# Patient Record
Sex: Female | Born: 1965 | Race: White | Hispanic: No | Marital: Married | Smoking: Current every day smoker
Health system: Southern US, Community
[De-identification: ages and names within clinical notes are randomized; demographics above are authoritative.]

## PROBLEM LIST (undated history)

## (undated) DIAGNOSIS — K644 Residual hemorrhoidal skin tags: Secondary | ICD-10-CM

## (undated) DIAGNOSIS — G43909 Migraine, unspecified, not intractable, without status migrainosus: Secondary | ICD-10-CM

## (undated) DIAGNOSIS — F329 Major depressive disorder, single episode, unspecified: Secondary | ICD-10-CM

## (undated) DIAGNOSIS — M5430 Sciatica, unspecified side: Secondary | ICD-10-CM

## (undated) DIAGNOSIS — F32A Depression, unspecified: Secondary | ICD-10-CM

## (undated) HISTORY — PX: KNEE ARTHROSCOPY: SUR90

## (undated) HISTORY — DX: Sciatica, unspecified side: M54.30

## (undated) HISTORY — DX: Residual hemorrhoidal skin tags: K64.4

## (undated) HISTORY — PX: EYE SURGERY: SHX253

## (undated) HISTORY — DX: Migraine, unspecified, not intractable, without status migrainosus: G43.909

## (undated) HISTORY — PX: LAPAROSCOPY: SHX197

---

## 2005-03-30 ENCOUNTER — Emergency Department (HOSPITAL_COMMUNITY): Admission: EM | Admit: 2005-03-30 | Discharge: 2005-03-30 | Payer: Self-pay | Admitting: Emergency Medicine

## 2006-02-21 DIAGNOSIS — Z7289 Other problems related to lifestyle: Secondary | ICD-10-CM | POA: Insufficient documentation

## 2006-03-09 ENCOUNTER — Emergency Department: Payer: Self-pay | Admitting: Emergency Medicine

## 2006-08-21 DIAGNOSIS — J309 Allergic rhinitis, unspecified: Secondary | ICD-10-CM | POA: Insufficient documentation

## 2007-10-10 DIAGNOSIS — G43109 Migraine with aura, not intractable, without status migrainosus: Secondary | ICD-10-CM | POA: Insufficient documentation

## 2008-01-14 DIAGNOSIS — F419 Anxiety disorder, unspecified: Secondary | ICD-10-CM | POA: Insufficient documentation

## 2008-01-14 DIAGNOSIS — R0789 Other chest pain: Secondary | ICD-10-CM | POA: Insufficient documentation

## 2008-01-15 ENCOUNTER — Other Ambulatory Visit: Payer: Self-pay

## 2008-01-15 ENCOUNTER — Emergency Department: Payer: Self-pay | Admitting: Emergency Medicine

## 2008-07-02 DIAGNOSIS — M545 Low back pain, unspecified: Secondary | ICD-10-CM | POA: Insufficient documentation

## 2008-09-21 ENCOUNTER — Ambulatory Visit: Payer: Self-pay | Admitting: Family Medicine

## 2008-11-09 ENCOUNTER — Ambulatory Visit: Payer: Self-pay | Admitting: Unknown Physician Specialty

## 2008-12-07 ENCOUNTER — Ambulatory Visit: Payer: Self-pay | Admitting: Unknown Physician Specialty

## 2008-12-08 ENCOUNTER — Ambulatory Visit: Payer: Self-pay | Admitting: Unknown Physician Specialty

## 2009-02-13 ENCOUNTER — Emergency Department: Payer: Self-pay | Admitting: Emergency Medicine

## 2009-03-08 DIAGNOSIS — S39012A Strain of muscle, fascia and tendon of lower back, initial encounter: Secondary | ICD-10-CM | POA: Insufficient documentation

## 2009-03-29 ENCOUNTER — Ambulatory Visit: Payer: Self-pay | Admitting: Family Medicine

## 2009-04-17 ENCOUNTER — Emergency Department (HOSPITAL_COMMUNITY): Admission: EM | Admit: 2009-04-17 | Discharge: 2009-04-18 | Payer: Self-pay | Admitting: Emergency Medicine

## 2009-04-18 DIAGNOSIS — J45909 Unspecified asthma, uncomplicated: Secondary | ICD-10-CM | POA: Insufficient documentation

## 2009-05-19 ENCOUNTER — Ambulatory Visit: Payer: Self-pay | Admitting: Family Medicine

## 2009-09-25 ENCOUNTER — Ambulatory Visit: Payer: Self-pay | Admitting: Family Medicine

## 2009-09-25 DIAGNOSIS — R519 Headache, unspecified: Secondary | ICD-10-CM | POA: Insufficient documentation

## 2009-09-25 DIAGNOSIS — R51 Headache: Secondary | ICD-10-CM | POA: Insufficient documentation

## 2010-01-20 ENCOUNTER — Ambulatory Visit: Payer: Self-pay | Admitting: Family Medicine

## 2010-01-20 DIAGNOSIS — F17200 Nicotine dependence, unspecified, uncomplicated: Secondary | ICD-10-CM | POA: Insufficient documentation

## 2010-03-21 LAB — LIPID PANEL
CHOLESTEROL: 201 mg/dL — AB (ref 0–200)
HDL: 69 mg/dL (ref 35–70)
LDL CALC: 115 mg/dL
Triglycerides: 86 mg/dL (ref 40–160)

## 2010-03-21 LAB — BASIC METABOLIC PANEL
BUN: 9 mg/dL (ref 4–21)
Creatinine: 0.7 mg/dL (ref 0.5–1.1)
Potassium: 4.3 mmol/L (ref 3.4–5.3)
Sodium: 139 mmol/L (ref 137–147)

## 2010-04-11 ENCOUNTER — Telehealth (INDEPENDENT_AMBULATORY_CARE_PROVIDER_SITE_OTHER): Payer: Self-pay

## 2010-04-11 ENCOUNTER — Ambulatory Visit
Admission: RE | Admit: 2010-04-11 | Discharge: 2010-04-11 | Payer: Self-pay | Source: Home / Self Care | Attending: Family Medicine | Admitting: Family Medicine

## 2010-04-11 DIAGNOSIS — B070 Plantar wart: Secondary | ICD-10-CM | POA: Insufficient documentation

## 2010-04-15 ENCOUNTER — Encounter: Payer: Self-pay | Admitting: Family Medicine

## 2010-05-09 NOTE — Assessment & Plan Note (Signed)
Summary: MIGRAINE/EVM   Vital Signs:  Patient profile:   45 year old female Height:      70 inches Weight:      187 pounds O2 Sat:      100 % on Room air Temp:     97.7 degrees F oral Pulse rate:   88 / minute Pulse rhythm:   regular Resp:     18 per minute BP sitting:   139 / 86  (right arm)  Vitals Entered By: Levonne Spiller EMT-P (January 20, 2010 11:28 AM)  O2 Flow:  Room air CC: Headache, Possible Migraine Is Patient Diabetic? No Pain Assessment Patient in pain? yes     Location: head Intensity: 7 Type: aching  Does patient need assistance? Functional Status Shopping in her or her is she pulled  Visit Type:  headache - migraine  Chief Complaint:  Headache and Possible Migraine.  History of Present Illness: This patient is a 45 year old [place] female presenting today complaining of a headache that's been present for the past several hours. She reported that it initially started as a tension type headache which she has had in the past but he has transformed into a migraine headache. She's having nausea and photophobia. She does have a history of migraine headaches. She had been taking Fioricet in the past but has run out of her medication. She reports that she's not having any vomiting or fever or chills. The patient reports that she would like to go home and sleep but she's having to work today in the [employer].the patient reports that she is planning to go home. She has been under tremendous stress under the last 4 days because her ex-husband committed suicide last week. Her children have been under distress and she has been working with him and her ex in-laws. The patient reports that she is not sleeping well.  The patient reports that her pain is 7/10.  Preventive Screening-Counseling & Management  Alcohol-Tobacco     Smoking Status: current     Packs/Day: 1.0     Tobacco Counseling: to quit use of tobacco products  Caffeine-Diet-Exercise     Caffeine  use/day: 10 cups/ perday  Problems Prior to Update: 1)  Headache  (ICD-784.0)  Allergies (verified): No Known Drug Allergies  Past History:  Family History: Last updated: 09/25/2009 Father: Alive, ok Mother: died from Brain cancer, metastatic from breast Siblings: 1 sister, ok  Social History: Last updated: 09/25/2009 Married, 4 children Current Smoker Alcohol use-no Drug use-no  Risk Factors: Caffeine Use: 10 cups/ perday (01/20/2010)  Risk Factors: Smoking Status: current (01/20/2010) Packs/Day: 1.0 (01/20/2010)  Past Medical History: migraine headaches Tension-type headaches Chronic active nicotine dependence  Past Surgical History: Caesarean sections-3 times 8/10- knee operation  Family History: Reviewed history from 09/25/2009 and no changes required. Father: Alive, ok Mother: died from Brain cancer, metastatic from breast Siblings: 1 sister, ok  Social History: Reviewed history from 09/25/2009 and no changes required. Married, 4 children Current Smoker Alcohol use-no Drug use-no Packs/Day:  1.0 Caffeine use/day:  10 cups/ perday  Review of Systems       The patient complains of headaches.  The patient denies anorexia, fever, weight loss, weight gain, vision loss, decreased hearing, hoarseness, chest pain, syncope, dyspnea on exertion, peripheral edema, prolonged cough, hemoptysis, abdominal pain, melena, hematochezia, severe indigestion/heartburn, hematuria, incontinence, genital sores, muscle weakness, suspicious skin lesions, transient blindness, difficulty walking, depression, unusual weight change, abnormal bleeding, enlarged lymph nodes, angioedema, and breast masses.  Physical Exam  General:  Well-developed,well-nourished,in no acute distress; alert,appropriate and cooperative throughout examination Head:  Normocephalic and atraumatic without obvious abnormalities. No apparent alopecia or balding. Eyes:  No corneal or conjunctival  inflammation noted. EOMI. Perrla. Funduscopic exam benign, without hemorrhages, exudates or papilledema. Vision grossly normal. Ears:  External ear exam shows no significant lesions or deformities.  Otoscopic examination reveals clear canals, tympanic membranes are intact bilaterally without bulging, retraction, inflammation or discharge. Hearing is grossly normal bilaterally. Nose:  mucosal erythema and mucosal edema.   Mouth:  Oral mucosa and oropharynx without lesions or exudates.  Teeth in good repair. tonsils hypertropied.   Neck:  No deformities, masses, or tenderness noted. Chest Wall:  No deformities, masses, or tenderness noted. Lungs:  Normal respiratory effort, chest expands symmetrically. Lungs are clear to auscultation, no crackles or wheezes. Heart:  Normal rate and regular rhythm. S1 and S2 normal without gallop, murmur, click, rub or other extra sounds. Abdomen:  Bowel sounds positive,abdomen soft and non-tender without masses, organomegaly or hernias noted. Pulses:  R and L carotid,radial,femoral,dorsalis pedis and posterior tibial pulses are full and equal bilaterally Extremities:  No clubbing, cyanosis, edema, or deformity noted with normal full range of motion of all joints.   Neurologic:  No cranial nerve deficits noted. Station and gait are normal. Plantar reflexes are down-going bilaterally. DTRs are symmetrical throughout. Sensory, motor and coordinative functions appear intact. Cervical Nodes:  R anterior LN tender and R posterior LN tender.   Psych:  Cognition and judgment appear intact. Alert and cooperative with normal attention span and concentration. No apparent delusions, illusions, hallucinations   Impression & Recommendations:  Problem # 1:  MIGRAINE HEADACHE (ICD-346.90) Assessment New  Her updated medication list for this problem includes:    Fioricet 50-325-40 Mg Tabs (Butalbital-apap-caffeine) .Marland Kitchen... 1 q 4h as needed for  headache.  Orders: Ketorolac-Toradol 60 mg (E4540)  Phenergan 25 mg by mouth every 6 hours as needed for nausea and vomiting  The risks, benefits and possible side effects were clearly explained and discussed with the patient.  The patient verbalized clear understanding.  The patient was given instructions to return if symptoms don't improve, worsen or new changes develop.  If it is not during clinic hours and the patient cannot get back to this clinic then the patient was told to seek medical care at an available urgent care or emergency department.  The patient verbalized understanding.    Problem # 2:  CIGARETTE SMOKER (ICD-305.1) Assessment: Unchanged The patient was counseled and advised to stop using all tobacco products.  Medical assistance was offered and the patient was encouraged to call 1-800-QUIT-NOW to get a smoking cessation coach.     Complete Medication List: 1)  Fioricet 50-325-40 Mg Tabs (Butalbital-apap-caffeine) .Marland Kitchen.. 1 q 4h as needed for headache. 2)  Promethazine Hcl 25 Mg Tabs (Promethazine hcl) .... Take 1 by mouth every 6 hours as needed for nausea and vomiting. will cause drowsiness  Other Orders: Ketorolac-Toradol 15mg  ([id number])  Patient Instructions: 1)  Oral Rehydration Solution: drink 1/2 ounce every 15 minutes. If tolerated afert 1 hour, drink 1 ounce every 15 minutes. As you can tolerate, keep adding 1/2 ounce every 15 minutes, up to a total of 2-4 ounces. Contact the office if unable to tolerate oral solution, if you keep vomiting, or you continue to have signs of dehydration. 2)  It was clearly explained to the patient that this [facility] is not intended to be a primary care clinic.  The patient is always better served by the continuity of care and the provider/patient relationships developed with their dedicated primary care provider.  The patient was told to be sure to follow up as soon as possible with their primary care provider to discuss treatments  received and to receive further examination and testing.  The patient verbalized understanding. The will f/u with PCP ASAP. 3)  The patient was informed that there is no on-call provider or services available at this clinic during off-hours (when the clinic is closed).  If the patient developed a problem or concern that required immediate attention, the patient was advised to go the the nearest available urgent care or emergency department for medical care.  The patient verbalized understanding.    4)  Please schedule an appointment with your primary doctor in : 1-3 days Prescriptions: PROMETHAZINE HCL 25 MG TABS (PROMETHAZINE HCL) take 1 by mouth every 6 hours as needed for nausea and vomiting. Will cause drowsiness  #20 x 0   Entered and Authorized by:   Standley Dakins MD   Signed by:   Standley Dakins MD on 01/20/2010   Method used:   Electronically to        [facility] [address] (retail)       [address], [address]       [place], [place]  [address]       Ph: [phone]       Fax: [phone]   RxID:   [id number] FIORICET 50-325-40 MG TABS (BUTALBITAL-APAP-CAFFEINE) 1 q 4h as needed for headache.  #30 x 0   Entered and Authorized by:   Standley Dakins MD   Signed by:   Standley Dakins MD on 01/20/2010   Method used:   Electronically to        [facility] [address] (retail)       [address], [address]       [place], [place]  [address]       Ph: [phone]       Fax: [phone]   RxID:   [id number]    Medication Administration  Injection # 1:    Medication: Tordol 60mg     Diagnosis: MIGRAINE HEADACHE (ICD-346.90)    Route: IM    Site: LUOQ gluteus    Exp Date: 03/10/2011    Lot #: [id number]    Mfr: Hospira    Patient tolerated injection without complications    Given by: Levonne Spiller EMT-P (January 20, 2010 11:51 AM)  Orders Added: 1)  Ketorolac-Toradol 15mg  [[id number]]  Rodney Langton,  M.D., F.A.A.F.P.  January 20, 2010

## 2010-05-09 NOTE — Assessment & Plan Note (Signed)
Summary: MIGRAINE/JBB   Vital Signs:  Patient Profile:   45 Years Old Female CC:      Headache / RWT Height:     70 inches Weight:      174 pounds BMI:     25.06 O2 Sat:      100 % O2 treatment:    Room Air Temp:     97.9 degrees F oral Pulse rate:   68 / minute Pulse rhythm:   regular Resp:     18 per minute BP sitting:   121 / 80  (left arm)  Pt. in pain?   yes    Location:   head    Intensity:   7    Type:       aching  Vitals Entered By: Levonne Spiller EMT-P (September 25, 2009 11:57 AM)              Is Patient Diabetic? No Comments Pt. is a smoker. 1 pack per day.  / RWT      Current Allergies: No known allergies History of Present Illness History from: patient Chief Complaint: Headache / RWT History of Present Illness: She works her @ [employer] and has been having a headache for 4 days-she thought may have been sinus, but not getting better and now thinks it is a migraine.  She can take a few Fioricets and that usually takes care of it.  She says she only gets headaches every 2 months or so, and so does not need anything to prevent them. She thought that may be from allergies, triggered this one, but now does not think so.  She is not having photophobia,, no nausea, and hurts frontal area.  REVIEW OF SYSTEMS Constitutional Symptoms      Denies fever, chills, night sweats, weight loss, weight gain, and fatigue.  Eyes       Denies change in vision, eye pain, eye discharge, glasses, contact lenses, and eye surgery. Ear/Nose/Throat/Mouth       Complains of sinus problems.      Denies hearing loss/aids, change in hearing, ear pain, ear discharge, dizziness, frequent runny nose, frequent nose bleeds, sore throat, hoarseness, and tooth pain or bleeding.  Respiratory       Denies dry cough, productive cough, wheezing, shortness of breath, asthma, bronchitis, and emphysema/COPD.  Cardiovascular       Denies murmurs, chest pain, and tires easily with exhertion.     Gastrointestinal       Denies stomach pain, nausea/vomiting, diarrhea, constipation, blood in bowel movements, and indigestion. Genitourniary       Denies painful urination, kidney stones, and loss of urinary control. Neurological       Complains of headaches.      Denies paralysis, seizures, and fainting/blackouts. Musculoskeletal       Denies muscle pain, joint pain, joint stiffness, decreased range of motion, redness, swelling, muscle weakness, and gout.  Skin       Denies bruising, unusual mles/lumps or sores, and hair/skin or nail changes.  Psych       Denies mood changes, temper/anger issues, anxiety/stress, speech problems, depression, and sleep problems. Other Comments: Take Claritin for allergies. has tried a few Advil for the headache, no relief.   Past History:  Past medical, surgical, family and social histories (including risk factors) reviewed for relevance to current acute and chronic problems.  Past Surgical History: Caesarean sections-3 times 8/10- knee   Family History: Reviewed history and no changes required. Father: Alive, ok  Mother: died from Brain cancer, metastatic from breast Siblings: 1 sister, ok  Social History: Reviewed history and no changes required. Married, 4 children Current Smoker Alcohol use-no Drug use-no Smoking Status:  current Drug Use:  no Physical Exam General appearance: well developed, well nourished, no acute distress, appears a little uncomftorble Head: normocephalic, atraumatic Eyes: conjunctivae and lids normal Pupils: equal, round, reactive to light Ears: normal, no lesions or deformities Nasal: mucosa pink, nonedematous, no septal deviation, turbinates normal Oral/Pharynx: tongue normal, posterior pharynx with erythema, but nor exudate Neck: neck supple,  trachea midline, no masses Thyroid: no nodules, masses, tenderness, or enlargement Chest/Lungs: no rales, wheezes. Mild  rhonchi bilateral, clearing after  cough-usual smoker's, allergies cough she says. Heart: regular rate and  rhythm, no murmur Extremities: normal extremities Neurological: grossly intact and non-focal Skin: no obvious rashes or lesions MSE: oriented to time, place, and person. Affect approriate. Assessment New Problems: HEADACHE (ICD-784.0)  She calls this a "migraine" but appears to be more of tension.  If no more than once in 2 months, not that bad.  Plan New Medications/Changes: FIORICET 50-325-40 MG TABS (BUTALBITAL-APAP-CAFFEINE) 1 q 4h as needed for headache.  #30 x 0, 09/26/2009, Oniyah Rohe MD  New Orders: New Patient Level III [phone]  The patient and/or caregiver has been counseled thoroughly with regard to medications prescribed including dosage, schedule, interactions, rationale for use, and possible side effects and they verbalize understanding.  Diagnoses and expected course of recovery discussed and will return if not improved as expected or if the condition worsens. Patient and/or caregiver verbalized understanding.  Prescriptions: FIORICET 50-325-40 MG TABS (BUTALBITAL-APAP-CAFFEINE) 1 q 4h as needed for headache.  #30 x 0   Entered and Authorized by:   Kathrynn Running MD   Signed by:   Standley Dakins MD on 09/26/2009   Method used:   Telephoned to ...       [facility] [address] (retail)       [address]       [place], [place]  [address]       Ph: [phone]       Fax: [phone]   RxID:   [id number]   Patient Instructions: 1)  If no better and feels like sinus infection in next 2 days or so, call or stop by-may need antibiotic. 2)  Also may use 4 Advil gelcaps at once for headache.   Orders Added: 1)  New Patient Level III [99203]   It was clearly explained to the patient that this [facility] is not intended to be a primary care clinic.  The patient is always better served by the continuity of care and the provider/patient  relationships developed with their dedicated primary care provider.  The patient was told to be sure to follow up as soon as possible with their primary care provider to discuss treatments received and to receive further examination and testing.  The patient verbalized understanding. The will f/u with PCP ASAP.   The risks, benefits and possible side effects were clearly explained and discussed with the patient.  The patient verbalized clear understanding.  The patient was given instructions to return if symptoms don't improve, worsen or new changes develop.  If it is not during clinic hours and the patient cannot get back to this clinic then the patient was told to seek medical care at an available urgent care or emergency department.  The patient verbalized  understanding.    I have reviewed the above medical office visit documention, including diagnoses, history, medications, clinical lists, orders and plan of care.   Rodney Langton, MD, FAAFP  September 26, 2009

## 2010-05-11 NOTE — Progress Notes (Signed)
  Phone Note Outgoing Call   Call placed by: Levonne Spiller EMT-P,  April 11, 2010 5:30 PM Call placed to: Patient Action Taken: Phone Call Completed Summary of Call: [facility] Jan. 12th @ 2:30.  Pt. was advised to contact [facility] if she has a schedule conflict, and to also contact our office if she has any questions. / rwt Initial call taken by: Levonne Spiller EMT-P,  April 11, 2010 5:32 PM

## 2010-05-11 NOTE — Miscellaneous (Signed)
Summary: Tramadol not working  Clinical Lists Changes   Pt walked up to the front desk and said that the tramadol is not working to help her foot pain and she is scheduled to see her podiatrist next week.  She says that it tends to hurt more at night and needs something to take at night.  She asked for another pain medicine to be given.     Medications: Added new medication of HYDROCODONE-ACETAMINOPHEN 5-500 MG TABS (HYDROCODONE-ACETAMINOPHEN) take 1 by mouth at HS as needed severe foot pain: Caution Will Cause Drowsiness - Signed Rx of HYDROCODONE-ACETAMINOPHEN 5-500 MG TABS (HYDROCODONE-ACETAMINOPHEN) take 1 by mouth at HS as needed severe foot pain: Caution Will Cause Drowsiness;  #15 x 0;  Signed;  Entered by: Standley Dakins MD;  Authorized by: Standley Dakins MD;  Method used: Print then Give to Patient    Prescriptions: HYDROCODONE-ACETAMINOPHEN 5-500 MG TABS (HYDROCODONE-ACETAMINOPHEN) take 1 by mouth at HS as needed severe foot pain: Caution Will Cause Drowsiness  #15 x 0   Entered and Authorized by:   Standley Dakins MD   Signed by:   Standley Dakins MD on 04/15/2010   Method used:   Print then Give to Patient   RxID:   [id number]

## 2010-05-11 NOTE — Assessment & Plan Note (Signed)
Summary: toe problems/bursitis/jbb   Vital Signs:  Patient profile:   45 year old female Height:      70 inches Weight:      192 pounds BMI:     27.65 O2 Sat:      99 % on Room air Temp:     97.8 degrees F oral Pulse rate:   89 / minute Pulse rhythm:   regular Resp:     18 per minute BP sitting:   125 / 83  (right arm)  Vitals Entered By: Levonne Spiller EMT-P (April 11, 2010 4:17 PM)  O2 Flow:  Room air CC: Left foot pain/ Toes Is Patient Diabetic? No Pain Assessment Patient in pain? yes     Location: foot Intensity: 7 Type: aching Onset of pain  Gradual  Does patient need assistance? Functional Status Self care Ambulation Normal   Chief Complaint:  Left foot pain/ Toes.  History of Present Illness: The patient is presenting today because she has been having pain in the left 4th and 5th toes.  She says that she is concerned about the worsening pain that she has been experiencing in the area.  The patient is presenting today because she is having a lot of pain in the foot.  She is reporting that she is having trouble walking and putting pressure on the foot when walking.  She says that it feels like there is a callous there.  The patient is reporting that she really has a lot of pain if that area between the toes is touched or manipulated.  She reports that she is not sure where it came from.  She has not had any history of problems with her feet or toes before.  She is not having any fever or chills or rash or nausea.   Allergies (verified): No Known Drug Allergies  Past History:  Family History: Last updated: 09/25/2009 Father: Alive, ok Mother: died from Brain cancer, metastatic from breast Siblings: 1 sister, ok  Social History: Last updated: 09/25/2009 Married, 4 children Current Smoker Alcohol use-no Drug use-no  Risk Factors: Caffeine Use: 10 cups/ perday (01/20/2010)  Risk Factors: Smoking Status: current (01/20/2010) Packs/Day: 1.0  (01/20/2010)  Past Medical History: Reviewed history from 01/20/2010 and no changes required. migraine headaches Tension-type headaches Chronic active nicotine dependence  Past Surgical History: Reviewed history from 01/20/2010 and no changes required. Caesarean sections-3 times 8/10- knee operation  Family History: Reviewed history from 09/25/2009 and no changes required. Father: Alive, ok Mother: died from Brain cancer, metastatic from breast Siblings: 1 sister, ok  Social History: Reviewed history from 09/25/2009 and no changes required. Married, 4 children Current Smoker Alcohol use-no Drug use-no  Review of Systems General:  Denies chills, fatigue, fever, loss of appetite, malaise, sleep disorder, sweats, weakness, and weight loss. Eyes:  Denies blurring, discharge, double vision, eye irritation, eye pain, halos, itching, light sensitivity, red eye, vision loss-1 eye, and vision loss-both eyes. ENT:  Denies decreased hearing, difficulty swallowing, ear discharge, earache, hoarseness, nasal congestion, nosebleeds, postnasal drainage, ringing in ears, sinus pressure, and sore throat. CV:  Denies bluish discoloration of lips or nails, chest pain or discomfort, difficulty breathing at night, difficulty breathing while lying down, fainting, fatigue, leg cramps with exertion, lightheadness, near fainting, palpitations, shortness of breath with exertion, swelling of feet, swelling of hands, and weight gain. Resp:  Denies chest discomfort, chest pain with inspiration, cough, coughing up blood, excessive snoring, hypersomnolence, morning headaches, pleuritic, shortness of breath, sputum productive,  and wheezing. GI:  Denies abdominal pain, bloody stools, change in bowel habits, constipation, dark tarry stools, diarrhea, excessive appetite, gas, hemorrhoids, indigestion, loss of appetite, nausea, vomiting, vomiting blood, and yellowish skin color. GU:  Denies abnormal vaginal bleeding,  decreased libido, discharge, dysuria, genital sores, hematuria, incontinence, nocturia, urinary frequency, and urinary hesitancy. MS:  Complains of joint pain; denies joint redness, joint swelling, loss of strength, low back pain, mid back pain, muscle aches, muscle , cramps, muscle weakness, stiffness, and thoracic pain; patient having pain between toes 4-5 on the left foot.  . Derm:  Denies changes in color of skin, changes in nail beds, dryness, excessive perspiration, flushing, hair loss, insect bite(s), itching, lesion(s), poor wound healing, and rash. Neuro:  Denies brief paralysis, difficulty with concentration, disturbances in coordination, falling down, headaches, inability to speak, memory loss, numbness, poor balance, seizures, sensation of room spinning, tingling, tremors, visual disturbances, and weakness. Psych:  Denies alternate hallucination ( auditory/visual), anxiety, depression, easily angered, easily tearful, irritability, mental problems, panic attacks, sense of great danger, suicidal thoughts/plans, thoughts of violence, unusual visions or sounds, and thoughts /plans of harming others. Endo:  Denies cold intolerance, excessive hunger, excessive thirst, excessive urination, heat intolerance, polyuria, and weight change. Heme:  Denies abnormal bruising, bleeding, enlarge lymph nodes, fevers, pallor, and skin discoloration. Allergy:  Denies hives or rash, itching eyes, persistent infections, seasonal allergies, and sneezing.  Physical Exam  General:  Well-developed,well-nourished,in no acute distress; alert,appropriate and cooperative throughout examination Head:  Normocephalic and atraumatic without obvious abnormalities. No apparent alopecia or balding. Eyes:  No corneal or conjunctival inflammation noted. EOMI. Perrla. Funduscopic exam benign, without hemorrhages, exudates or papilledema. Vision grossly normal. Ears:  External ear exam shows no significant lesions or deformities.   Otoscopic examination reveals clear canals, tympanic membranes are intact bilaterally without bulging, retraction, inflammation or discharge. Hearing is grossly normal bilaterally. Nose:  External nasal examination shows no deformity or inflammation. Nasal mucosa are pink and moist without lesions or exudates. Mouth:  Oral mucosa and oropharynx without lesions or exudates.  Teeth in good repair. Neck:  No deformities, masses, or tenderness noted. Lungs:  Normal respiratory effort, chest expands symmetrically. Lungs are clear to auscultation, no crackles or wheezes. Heart:  Normal rate and regular rhythm. S1 and S2 normal without gallop, murmur, click, rub or other extra sounds. Abdomen:  Bowel sounds positive,abdomen soft and non-tender without masses, organomegaly or hernias noted. Msk:  No deformity or scoliosis noted of thoracic or lumbar spine.   Extremities:  Large plantar wart present between the 4-5 toes on the left foot.  It is approximately 3-5 mm in diameter with a central core and very exquisitely tender to palpation and manipulation.  Neurologic:  No cranial nerve deficits noted. Station and gait are normal. Plantar reflexes are down-going bilaterally. DTRs are symmetrical throughout. Sensory, motor and coordinative functions appear intact. Skin:  Intact without suspicious lesions or rashes Cervical Nodes:  No lymphadenopathy noted Psych:  Cognition and judgment appear intact. Alert and cooperative with normal attention span and concentration. No apparent delusions, illusions, hallucinations   Impression & Recommendations:  Problem # 1:  PLANTAR WART (ICD-078.12)  Pt is going to be referred to a podiatrist asap to have an evaluation done and treatment and removal of the wart. The patient is scheduled to go to the podiatrist for an evaluation on January 12.  She was notified of the appointment time and date.   The patient was told that she needed to  protect the area with Dr.Scholls  foot pads to the area for buffering protection.  Also, we wrapped it in the office with gauze and tape it so that it will be secure.   I gave her an RX for tramadol 50 mg to take as needed for severe pain and she will take tylenol and ibuprofen as needed for the inflammation.  I explained to her that she would most likely require some ablation therapy like cryotherapy treatment or laser treatment or surgery but that would be up to the podiatrist after they have had an opportunity to evaluate her condition. The patient verbalized clear understanding.    Complete Medication List: 1)  Fioricet 50-325-40 Mg Tabs (Butalbital-apap-caffeine) .Marland Kitchen.. 1 q 4h as needed for headache. 2)  Tramadol Hcl 50 Mg Tabs (Tramadol hcl) .... Take 1 by mouth every 6 hours as needed foot pain,  caution will cause drowsiness  Patient Instructions: 1)  Go to the pharmacy and pick up your prescription (s).  It may take up to 30 mins for electronic prescriptions to be delivered to the pharmacy.  Please call if your pharmacy has not received your prescriptions after 30 minutes.   2)  Go to the [facility] in the store and pick up some protective toe pads to use to cushion the area of the plantar wart so that you can get some relief before you are seen by the podiatrist.  3)  Make sure you go to your appointment with the podiatrist and be seen and treated for your foot condition.  4)  The patient was informed that there is no on-call provider or services available at this clinic during off-hours (when the clinic is closed).  If the patient developed a problem or concern that required immediate attention, the patient was advised to go the the nearest available urgent care or emergency department for medical care.  The patient verbalized understanding.    Prescriptions: TRAMADOL HCL 50 MG TABS (TRAMADOL HCL) take 1 by mouth every 6 hours as needed foot pain,  Caution Will Cause Drowsiness  #20 x 0   Entered and Authorized by:    Standley Dakins MD   Signed by:   Standley Dakins MD on 04/11/2010   Method used:   Print then Give to Patient   RxID:   [id number]

## 2010-05-28 ENCOUNTER — Ambulatory Visit: Payer: 59 | Admitting: Family Medicine

## 2010-05-28 ENCOUNTER — Encounter: Payer: Self-pay | Admitting: Family Medicine

## 2010-05-28 DIAGNOSIS — G43819 Other migraine, intractable, without status migrainosus: Secondary | ICD-10-CM

## 2010-06-06 NOTE — Medication Information (Signed)
Summary: Tax adviser   Imported By: Dorna Leitz 05/29/2010 16:20:27  _____________________________________________________________________  External Attachment:    Type:   Image     Comment:   External Document

## 2010-06-06 NOTE — Progress Notes (Signed)
Summary: Office Visit  Office Visit   Imported By: Dorna Leitz 05/29/2010 16:18:22  _____________________________________________________________________  External Attachment:    Type:   Image     Comment:   External Document

## 2010-06-06 NOTE — Assessment & Plan Note (Signed)
Summary: MIGRAINE/EVM   Vital Signs:  Patient profile:   45 year old female Height:      70 inches Weight:      197 pounds Temp:     98.6 degrees F oral Pulse rate:   68 / minute Pulse rhythm:   regular Resp:     20 per minute BP sitting:   110 / 60  (right arm) Cuff size:   regular  Vitals Entered By: Providence Crosby LPN (May 28, 2010 2:56 PM)  Visit Type:  migraine   History of Present Illness: Came today for complaints of headache with nausea and sensitive to light ; awaken 05/26/2010 with headache which is getting worse states she has a history of migraines  Allergies: No Known Drug Allergies   Complete Medication List: 1)  Fioricet 50-325-40 Mg Tabs (Butalbital-apap-caffeine) .Marland Kitchen.. 1 q 4h as needed for headache. 2)  Tramadol Hcl 50 Mg Tabs (Tramadol hcl) .... Take 1 by mouth every 6 hours as needed foot pain,  caution will cause drowsiness 3)  Hydrocodone-acetaminophen 5-500 Mg Tabs (Hydrocodone-acetaminophen) .... Take 1 by mouth at hs as needed severe foot pain: caution will cause drowsiness  Other Orders: Ketorolac-Toradol 15mg  (Z6109) Admin of Therapeutic Inj  intramuscular or subcutaneous (60454)   Medication Administration  Injection # 1:    Medication: Ketorolac-Toradol 15mg     Diagnosis: HEADACHE (ICD-784.0)    Route: IM    Site: LUOQ gluteus    Exp Date: 03/10/2011    Lot #: [id number]    Mfr: hospira    Comments: Injection given at 12:15 pm with no ractions    Patient tolerated injection without complications    Given by: Providence Crosby LPN (May 28, 2010 3:18 PM)  Orders Added: 1)  Ketorolac-Toradol 15mg  [J1885] 2)  Admin of Therapeutic Inj  intramuscular or subcutaneous [96372] 3)  Est. Patient Level III [47829]     Medication Administration  Injection # 1:    Medication: Ketorolac-Toradol 15mg     Diagnosis: HEADACHE (ICD-784.0)    Route: IM    Site: LUOQ gluteus    Exp Date: 03/10/2011    Lot #: [id number]    Mfr: hospira    Comments:  Injection given at 12:15 pm with no ractions    Patient tolerated injection without complications    Given by: Providence Crosby LPN (May 28, 2010 3:18 PM)  Orders Added: 1)  Ketorolac-Toradol 15mg  [J1885] 2)  Admin of Therapeutic Inj  intramuscular or subcutaneous [96372] 3)  Est. Patient Level III [65784]

## 2010-06-25 LAB — URINALYSIS, ROUTINE W REFLEX MICROSCOPIC
Bilirubin Urine: NEGATIVE
Glucose, UA: NEGATIVE mg/dL
Ketones, ur: NEGATIVE mg/dL
Leukocytes, UA: NEGATIVE
Nitrite: NEGATIVE
Protein, ur: NEGATIVE mg/dL
Specific Gravity, Urine: 1.013 (ref 1.005–1.030)
Urobilinogen, UA: 0.2 mg/dL (ref 0.0–1.0)
pH: 5.5 (ref 5.0–8.0)

## 2010-06-25 LAB — CBC
HCT: 37.9 % (ref 36.0–46.0)
Hemoglobin: 12.9 g/dL (ref 12.0–15.0)
MCHC: 34.1 g/dL (ref 30.0–36.0)
MCV: 92.3 fL (ref 78.0–100.0)
Platelets: 216 10*3/uL (ref 150–400)
RBC: 4.11 MIL/uL (ref 3.87–5.11)
RDW: 12.6 % (ref 11.5–15.5)
WBC: 11.3 10*3/uL — ABNORMAL HIGH (ref 4.0–10.5)

## 2010-06-25 LAB — BASIC METABOLIC PANEL WITH GFR
CO2: 22 meq/L (ref 19–32)
Chloride: 102 meq/L (ref 96–112)
GFR calc Af Amer: >60 mL/min (ref >60)
Glucose, Bld: 101 mg/dL — ABNORMAL HIGH (ref 70–99)
Potassium: 3.6 meq/L (ref 3.5–5.1)
Sodium: 134 meq/L — ABNORMAL LOW (ref 135–145)

## 2010-06-25 LAB — DIFFERENTIAL
Basophils Absolute: 0.1 K/uL (ref 0.0–0.1)
Basophils Relative: 1 % (ref 0–1)
Eosinophils Absolute: 0.2 K/uL (ref 0.0–0.7)
Eosinophils Relative: 2 % (ref 0–5)
Lymphocytes Relative: 32 % (ref 12–46)
Lymphs Abs: 3.6 K/uL (ref 0.7–4.0)
Monocytes Absolute: 1.2 K/uL — ABNORMAL HIGH (ref 0.1–1.0)
Monocytes Relative: 11 % (ref 3–12)
Neutro Abs: 6.2 10*3/uL (ref 1.7–7.7)
Neutrophils Relative %: 55 % (ref 43–77)

## 2010-06-25 LAB — D-DIMER, QUANTITATIVE: D-Dimer, Quant: <0.22 ug/mL-FEU (ref 0.00–0.48)

## 2010-06-25 LAB — URINE MICROSCOPIC-ADD ON

## 2010-06-25 LAB — POCT CARDIAC MARKERS
CKMB, poc: <1 ng/mL — ABNORMAL LOW (ref 1.0–8.0)
Myoglobin, poc: 63.9 ng/mL (ref 12–200)
Troponin i, poc: <0.05 ng/mL (ref 0.00–0.09)

## 2010-06-25 LAB — POCT PREGNANCY, URINE: Preg Test, Ur: NEGATIVE

## 2010-06-25 LAB — BASIC METABOLIC PANEL
BUN: 8 mg/dL (ref 6–23)
Calcium: 8.6 mg/dL (ref 8.4–10.5)
Creatinine, Ser: 0.86 mg/dL (ref 0.4–1.2)
GFR calc non Af Amer: >60 mL/min (ref >60)

## 2010-06-28 ENCOUNTER — Encounter: Payer: Self-pay | Admitting: Internal Medicine

## 2010-06-28 ENCOUNTER — Ambulatory Visit (INDEPENDENT_AMBULATORY_CARE_PROVIDER_SITE_OTHER): Payer: 59 | Admitting: Internal Medicine

## 2010-06-28 DIAGNOSIS — M720 Palmar fascial fibromatosis [Dupuytren]: Secondary | ICD-10-CM | POA: Insufficient documentation

## 2010-06-28 DIAGNOSIS — M76899 Other specified enthesopathies of unspecified lower limb, excluding foot: Secondary | ICD-10-CM | POA: Insufficient documentation

## 2010-07-06 NOTE — Assessment & Plan Note (Signed)
Summary: sick    Current Allergies: No known allergies History of Present Illness Chief Complaint: bursitis x 1 yr and pinky problem x yrs History of Present Illness: Has been moving more boxes at work than usual and thinks this set off symptoms .  Pain is right greater trochanteric area that responded well 1 yr ago to injection. It's worse when she lays on her right side and is dull and has been bothering her for about 1 week. The left contracture of the fifth finger has been there since childhood and has gotten tender on dorsum of pipj in the last few days. It hasn't been swollen or discolored and she doesn't remember hitting it. She has run out of previous mobic and vicodin.  REVIEW OF SYSTEMS Constitutional Symptoms      Denies fever, chills, night sweats, weight loss, weight gain, and fatigue.  Eyes       Denies change in vision, eye pain, eye discharge, glasses, contact lenses, and eye surgery. Ear/Nose/Throat/Mouth       Denies hearing loss/aids, change in hearing, ear pain, ear discharge, dizziness, frequent runny nose, frequent nose bleeds, sinus problems, sore throat, hoarseness, and tooth pain or bleeding.  Respiratory       Denies dry cough, productive cough, wheezing, shortness of breath, asthma, bronchitis, and emphysema/COPD.  Cardiovascular       Denies murmurs, chest pain, and tires easily with exhertion.    Gastrointestinal       Denies stomach pain, nausea/vomiting, diarrhea, constipation, blood in bowel movements, and indigestion. Genitourniary       Denies painful urination, kidney stones, and loss of urinary control. Neurological       Denies paralysis, seizures, and fainting/blackouts. Musculoskeletal       Complains of joint pain, joint stiffness, decreased range of motion, and muscle weakness.      Denies redness, swelling, and gout.  Skin       Denies bruising, unusual mles/lumps or sores, and hair/skin or nail changes.  Psych       Denies mood changes,  temper/anger issues, anxiety/stress, speech problems, depression, and sleep problems.  Past History:  Past Medical History: Last updated: 01/20/2010 migraine headaches Tension-type headaches Chronic active nicotine dependence  Past Surgical History: Last updated: 01/20/2010 Caesarean sections-3 times 8/10- knee operation  Social History: Last updated: 06/28/2010 Married, 4 children Current Smoker Alcohol use-no Drug use-no Occupation: back Psychologist, sport and exercise at [employer]  Social History: Married, 4 children Current Smoker Alcohol use-no Drug use-no Occupation: back Psychologist, sport and exercise at [employer] Occupation:  employed Physical Exam General appearance: well developed, well nourished, no acute distress Extremities: left fifth finger with flexion contracture and mild tenderness dorsum of PIPJ without vis abnl otherwise. collat ligs intact. right hip with FROM but tenderness over greater trochanter. 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 Juline Patch MD MELOXICAM 15 MG TABS (MELOXICAM) 1 by mouth once daily pc  #30 x 0, 06/28/2010, J. Juline Patch MD PREDNISONE (PAK) 10 MG TABS (PREDNISONE) taper over 6 days  #1 x 0, 06/28/2010, J. Juline Patch MD   The patient and/or caregiver has been counseled thoroughly with regard to medications prescribed including dosage, schedule, interactions, rationale for use, and possible side effects and  they verbalize understanding.  Diagnoses and expected course of recovery discussed and will return if not improved as expected or if the condition worsens. Patient and/or caregiver verbalized  understanding.  Prescriptions: HYDROCODONE-ACETAMINOPHEN 5-500 MG TABS (HYDROCODONE-ACETAMINOPHEN) 1 by mouth q4h as needed pain  #15-20 x 0   Entered and Authorized by:   J. Juline Patch MD   Signed by:   Shela Commons. Juline Patch MD on 06/28/2010   Method used:   Print then Give to Patient   RxID:   [id number] MELOXICAM 15 MG TABS (MELOXICAM) 1 by mouth once daily pc  #30 x 0   Entered and Authorized by:   J. Juline Patch MD   Signed by:   Shela Commons. Juline Patch MD on 06/28/2010   Method used:   Print then Give to Patient   RxID:   [id number] PREDNISONE (PAK) 10 MG TABS (PREDNISONE) taper over 6 days  #1 x 0   Entered and Authorized by:   J. Juline Patch MD   Signed by:   Shela Commons. Juline Patch MD on 06/28/2010   Method used:   Print then Give to Patient   RxID:   [id number]   Patient Instructions: 1)  don't start mobic until prednisone done. 2)  try to avoid activity that made problems worse. 3)  consider hand orthopedist for Dupuytren's contracture repair if pain episodes more frequent.

## 2010-09-21 LAB — CBC AND DIFFERENTIAL
HEMATOCRIT: 42 % (ref 36–46)
HEMOGLOBIN: 14.4 g/dL (ref 12.0–16.0)
PLATELETS: 284 10*3/uL (ref 150–399)
WBC: 8.2 10*3/mL

## 2010-09-21 LAB — BASIC METABOLIC PANEL: Glucose: 78 mg/dL

## 2010-11-03 ENCOUNTER — Ambulatory Visit: Payer: Self-pay | Admitting: Podiatry

## 2011-01-14 IMAGING — CR DG CHEST 2V
2 series · 2 of 2 positions shown · non-contrast
Comparison: None

CLINICAL DATA: Shortness of breath.

CHEST - 2 VIEW

[w chest pa]
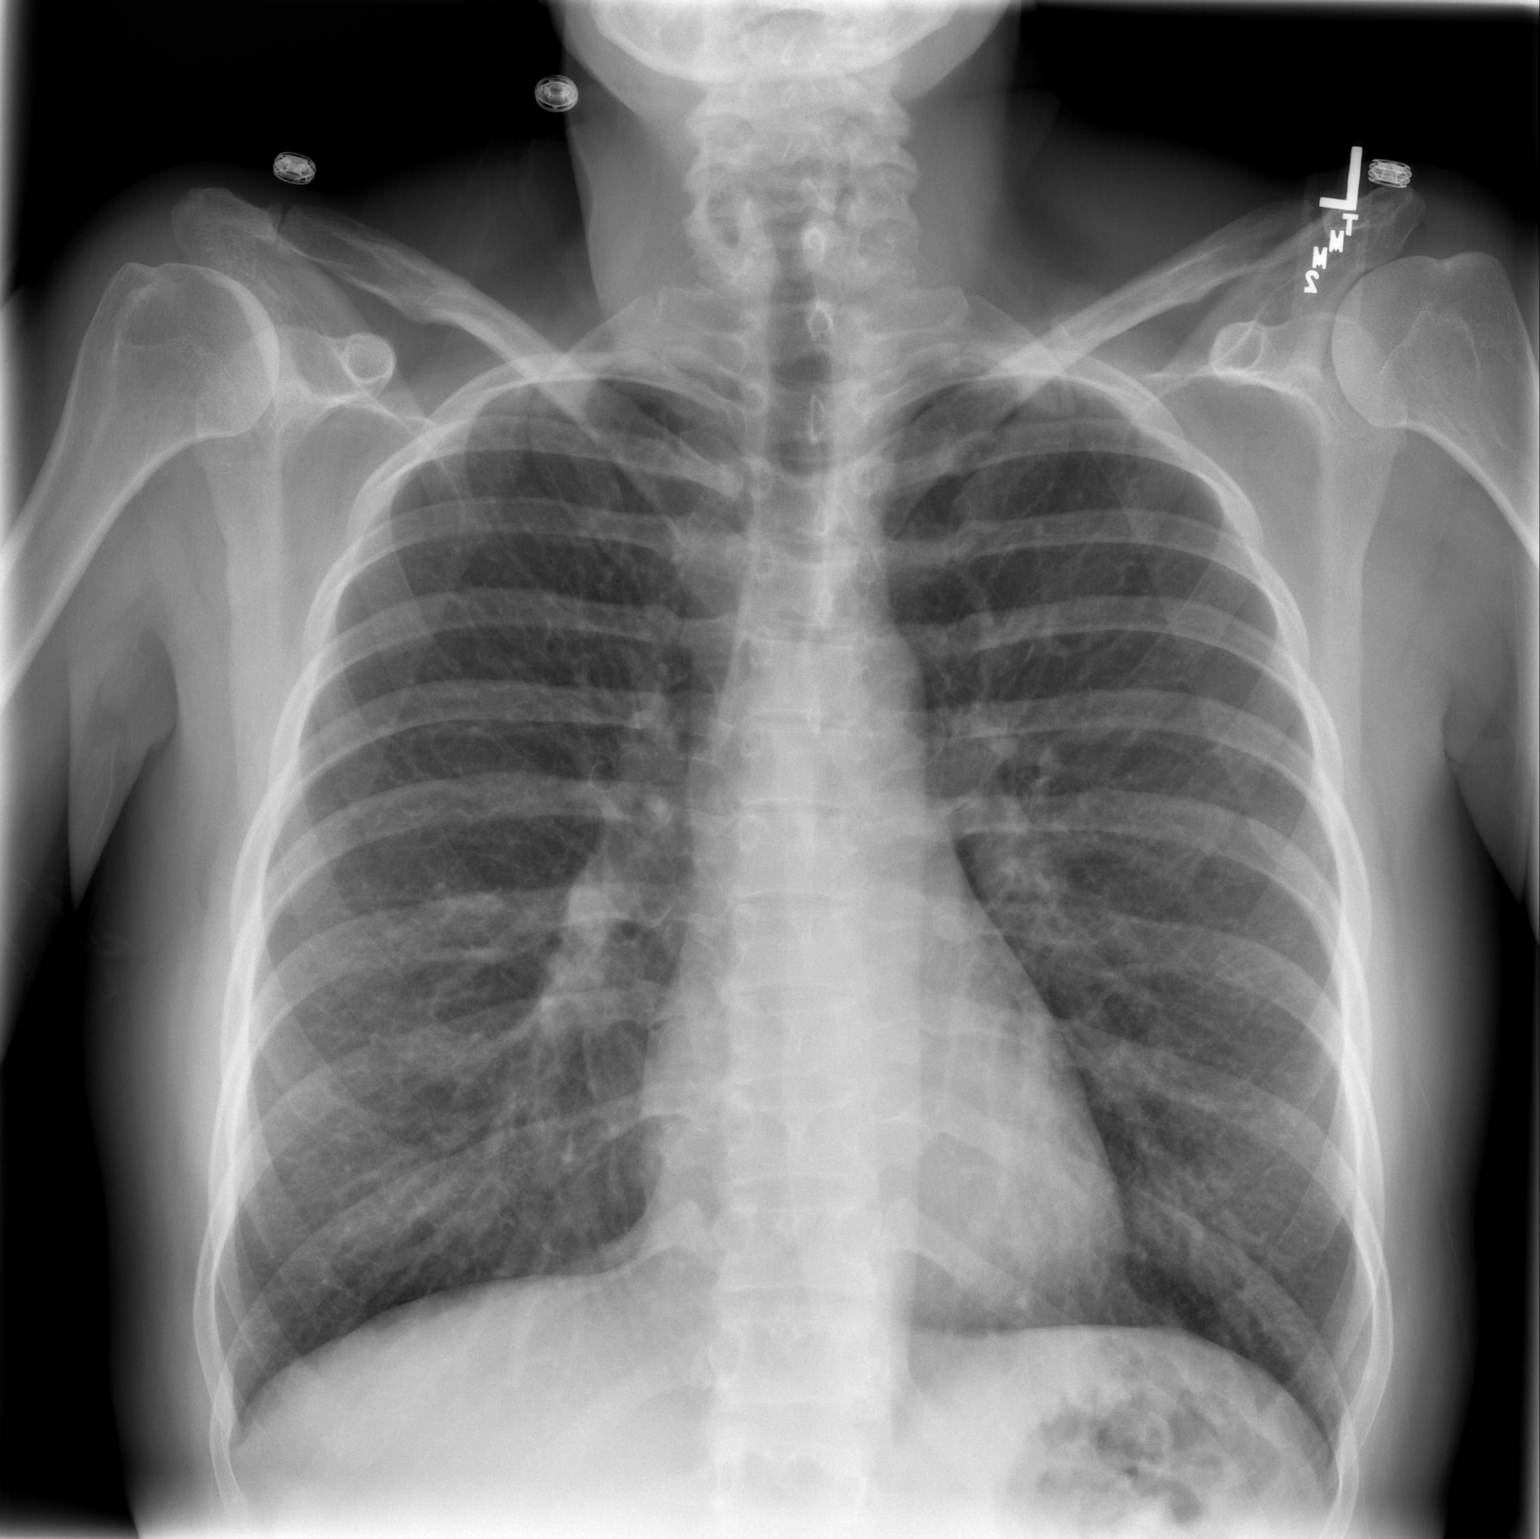

[w chest lat]
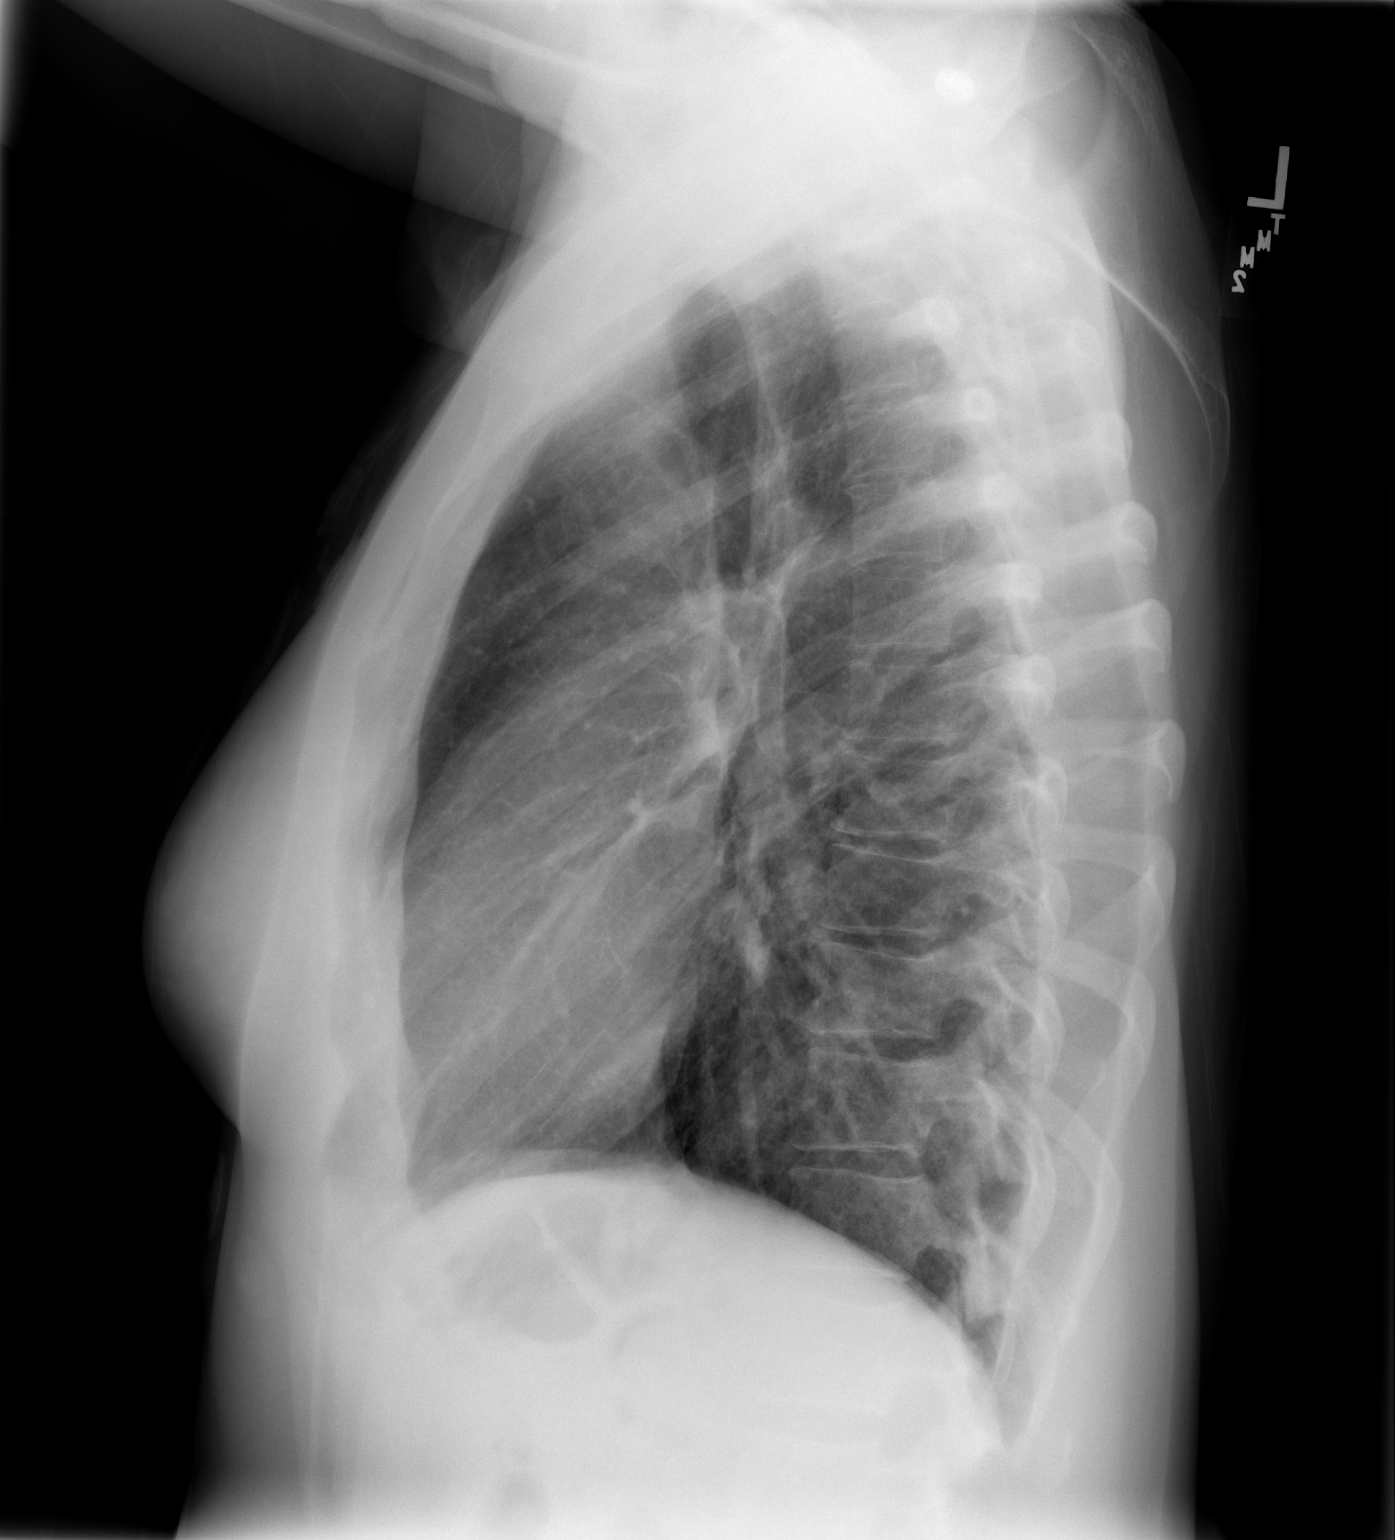

[2 of 2 positions shown; findings below may reference images not displayed]

FINDINGS: There is peribronchial thickening and mild interstitial
prominence.  Heart is normal size.  No confluent opacity or
effusion.  No acute bony abnormality.
IMPRESSION: Bronchitic changes.

## 2011-05-11 ENCOUNTER — Emergency Department (INDEPENDENT_AMBULATORY_CARE_PROVIDER_SITE_OTHER)
Admission: EM | Admit: 2011-05-11 | Discharge: 2011-05-11 | Disposition: A | Discharge status: Home / Self Care | Payer: 59 | Source: Home / Self Care | Attending: Family Medicine | Admitting: Family Medicine

## 2011-05-11 ENCOUNTER — Emergency Department (INDEPENDENT_AMBULATORY_CARE_PROVIDER_SITE_OTHER): Payer: 59

## 2011-05-11 DIAGNOSIS — M79609 Pain in unspecified limb: Secondary | ICD-10-CM

## 2011-05-11 DIAGNOSIS — M79672 Pain in left foot: Secondary | ICD-10-CM

## 2011-05-11 NOTE — ED Notes (Signed)
PT HERE WITH RIGHT LATERAL FOOT SHARP,ACHY CONSTANT PAIN WITH SITTING OR STANDING X 2 WKS.PT S/P FOOT INJURY IN SUMMER DIAG WITH STRESS FX BUT THE PAIN HASN'T STOPPED.PT TRIED ICE/ELEVATION AND IBUPROFEN BUT NO RELIEF

## 2011-05-11 NOTE — ED Provider Notes (Signed)
History     CSN: [id number]  Arrival date & time 05/11/11  0915   First MD Initiated Contact with Patient 05/11/11 [phone]      Chief Complaint  Patient presents with  . Foot Pain    (Consider location/radiation/quality/duration/timing/severity/associated sxs/prior treatment) Patient is a 46 y.o. female presenting with lower extremity pain. The history is provided by the patient.  Foot Pain This is a chronic problem. The current episode started more than 1 week ago (heard a pop in foot in summer of 2012 and seen by dr fowler ortho in [place], , sx continue, wants another opinion., has been in boot.). The problem has been gradually worsening. The symptoms are aggravated by walking.    No past medical history on file.  No past surgical history on file.  No family history on file.  History  Substance Use Topics  . Smoking status: Not on file  . Smokeless tobacco: Not on file  . Alcohol Use: Not on file    OB History    No data available      Review of Systems  Constitutional: Negative.   Musculoskeletal: Positive for gait problem.    Allergies  Review of patient's allergies indicates not on file.  Home Medications  No current outpatient prescriptions on file.  BP 127/83  Pulse 64  Temp(Src) 97.7 F (36.5 C) (Oral)  Resp 20  SpO2 97%  Physical Exam  Nursing note and vitals reviewed. Constitutional: She is oriented to person, place, and time. She appears well-developed and well-nourished.  Musculoskeletal: She exhibits tenderness.       Feet:  Neurological: She is alert and oriented to person, place, and time.  Skin: Skin is warm and dry.    ED Course  Procedures (including critical care time)  Labs Reviewed - No data to display Dg Foot Complete Left  05/11/2011  *RADIOLOGY REPORT*  Clinical Data: Lateral foot pain.  LEFT FOOT - COMPLETE 3+ VIEW  Comparison: 04/18/2010 left foot radiographs.  Findings: Mild soft tissue swelling is present lateral to the  fifth metatarsal head.  There is no fracture.  Alignment is anatomic. Bipartite great toe medial sesamoid is present.  The alignment of the foot is anatomic.  IMPRESSION: No acute osseous abnormality or interval change.  Soft tissue swelling lateral to the fifth metatarsal head.  Original Report Authenticated By: Andreas Newport, M.D.     1. Foot pain, left       MDM  X-rays reviewed and report per radiologist.         Barkley Bruns, MD 05/11/11 1056

## 2011-10-08 ENCOUNTER — Ambulatory Visit: Payer: Self-pay | Admitting: Family Medicine

## 2011-10-24 ENCOUNTER — Emergency Department: Payer: Self-pay | Admitting: Emergency Medicine

## 2012-01-07 ENCOUNTER — Ambulatory Visit: Payer: Self-pay | Admitting: Family Medicine

## 2012-11-25 ENCOUNTER — Ambulatory Visit: Payer: Self-pay | Admitting: Family Medicine

## 2014-08-26 ENCOUNTER — Other Ambulatory Visit: Payer: Self-pay | Admitting: Orthopedic Surgery

## 2014-08-26 DIAGNOSIS — M5412 Radiculopathy, cervical region: Secondary | ICD-10-CM

## 2014-09-03 ENCOUNTER — Ambulatory Visit
Admission: RE | Admit: 2014-09-03 | Discharge: 2014-09-03 | Disposition: A | Discharge status: Home / Self Care | Payer: 59 | Source: Ambulatory Visit | Attending: Orthopedic Surgery | Admitting: Orthopedic Surgery

## 2014-09-03 DIAGNOSIS — M5412 Radiculopathy, cervical region: Secondary | ICD-10-CM | POA: Insufficient documentation

## 2015-02-01 ENCOUNTER — Ambulatory Visit (INDEPENDENT_AMBULATORY_CARE_PROVIDER_SITE_OTHER): Payer: 59 | Admitting: Family Medicine

## 2015-02-01 ENCOUNTER — Encounter: Payer: Self-pay | Admitting: Family Medicine

## 2015-02-01 VITALS — BP 106/68 | HR 84 | Temp 98.3°F | Resp 16 | Ht 70.0 in | Wt 193.0 lb

## 2015-02-01 DIAGNOSIS — M76892 Other specified enthesopathies of left lower limb, excluding foot: Secondary | ICD-10-CM | POA: Diagnosis not present

## 2015-02-01 DIAGNOSIS — M7632 Iliotibial band syndrome, left leg: Secondary | ICD-10-CM | POA: Diagnosis not present

## 2015-02-01 DIAGNOSIS — M76891 Other specified enthesopathies of right lower limb, excluding foot: Secondary | ICD-10-CM

## 2015-02-01 DIAGNOSIS — R208 Other disturbances of skin sensation: Secondary | ICD-10-CM | POA: Insufficient documentation

## 2015-02-01 DIAGNOSIS — M7631 Iliotibial band syndrome, right leg: Secondary | ICD-10-CM | POA: Diagnosis not present

## 2015-02-01 DIAGNOSIS — N809 Endometriosis, unspecified: Secondary | ICD-10-CM | POA: Insufficient documentation

## 2015-02-01 DIAGNOSIS — M7061 Trochanteric bursitis, right hip: Secondary | ICD-10-CM

## 2015-02-01 DIAGNOSIS — M7062 Trochanteric bursitis, left hip: Secondary | ICD-10-CM | POA: Diagnosis not present

## 2015-02-01 DIAGNOSIS — K644 Residual hemorrhoidal skin tags: Secondary | ICD-10-CM | POA: Insufficient documentation

## 2015-02-01 MED ORDER — HYDROCODONE-ACETAMINOPHEN 5-325 MG PO TABS: 1.0000 | ORAL_TABLET | Freq: Four times a day (QID) | ORAL | Status: DC | PRN

## 2015-02-01 MED ORDER — METHYLPREDNISOLONE ACETATE 80 MG/ML IJ SUSP: 80.0000 mg | Freq: Once | INTRAMUSCULAR | Status: DC

## 2015-02-01 MED ORDER — MELOXICAM 15 MG PO TABS: 15.0000 mg | ORAL_TABLET | Freq: Every day | ORAL | Status: DC

## 2015-02-01 NOTE — Progress Notes (Signed)
Patient: Holly Austin Female    DOB: 1965-05-17   49 y.o.   MRN: [id number] Visit Date: 02/01/2015  Today's Provider: Mila Merry, MD   Chief Complaint  Patient presents with  . Hip Pain    x 2 months   Subjective:    Hip Pain  There was no injury mechanism (Pain started 2 months ago). The pain is present in the left leg and right hip. Quality: sharp. The pain is at a severity of 8/10. The pain is severe. The pain has been worsening since onset. Associated symptoms include an inability to bear weight. Pertinent negatives include no loss of motion, loss of sensation, muscle weakness, numbness or tingling. The symptoms are aggravated by movement and weight bearing. She has tried NSAIDs for the symptoms. The treatment provided no relief.  Patient states she has a history of bursitis in her right hip, but is not affecting her left hip even worse. She reports the pain has worsened to the point where it is affecting her gait. Patient states she also has pain in her legs and knee because of her having to walk differently. Has been taking up 4  tbuprofen a few times per day. She was seen a few times by orthopedics. She states she had injection of steroid into the bursae one time, which worked well, and another time had gluteal injection which did not.      No Known Allergies Previous Medications   No medications on file    Review of Systems  Constitutional: Negative for fever, chills, diaphoresis, appetite change and fatigue.  Respiratory: Negative for chest tightness and shortness of breath.   Cardiovascular: Negative for chest pain and palpitations.  Gastrointestinal: Negative for nausea, vomiting and abdominal pain.  Musculoskeletal: Positive for myalgias, back pain, arthralgias and gait problem. Negative for joint swelling.  Neurological: Negative for dizziness, tingling, weakness and numbness.    Social History  Substance Use Topics  . Smoking status: Current Every  Day Smoker -- 1.00 packs/day for 25 years    Types: Cigarettes  . Smokeless tobacco: Not on file  . Alcohol Use: No   Objective:   BP 106/68 mmHg  Pulse 84  Temp(Src) 98.3 F (36.8 C) (Oral)  Resp 16  Ht  (1.778 m)  Wt 193 lb (87.544 kg)  BMI 27.69 kg/m2  SpO2 96%  Physical Exam  General appearance: alert, well developed, well nourished, cooperative and in no distress Head: Normocephalic, without obvious abnormality, atraumatic Lungs: Respirations even and unlabored Extremities: No gross deformities Skin: Skin color, texture, turgor normal. No rashes seen  Psych: Appropriate mood and affect. Neurologic: Mental status: Alert, oriented to person, place, and time, thought content appropriate. MS: Moderate tenderness over both greater trochanter and iliotibial band    Assessment & Plan:     1. Trochanteric bursitis of both hips She states that her left hip is much worse than right - HYDROcodone-acetaminophen (NORCO/VICODIN) 5-325 MG tablet; Take 1 tablet by mouth every 6 (six) hours as needed for moderate pain.  Dispense: 40 tablet; Refill: 0 - meloxicam (MOBIC) 15 MG tablet; Take 1 tablet (15 mg total) by mouth daily. Take with food  Dispense: 30 tablet; Refill: 3  - methylPREDNISolone acetate (DEPO-MEDROL) injection 80 mg; 1 mL (80 mg total) by Intra-Lesional route once.  Prepped area with isopropyl alcohol and injection 1ml of /mL Depo-Medrol over left greater trochanter which was well tolerated by patient.   2. Iliotibial band  syndrome of both sides  Call if symptoms change or if not rapidly improving.           Mila Merryonald Fisher, MD  [facility]

## 2015-02-16 ENCOUNTER — Other Ambulatory Visit: Payer: Self-pay | Admitting: *Deleted

## 2015-02-16 DIAGNOSIS — M7061 Trochanteric bursitis, right hip: Secondary | ICD-10-CM

## 2015-02-16 DIAGNOSIS — M7062 Trochanteric bursitis, left hip: Principal | ICD-10-CM

## 2015-02-16 MED ORDER — HYDROCODONE-ACETAMINOPHEN 5-325 MG PO TABS: 1.0000 | ORAL_TABLET | Freq: Four times a day (QID) | ORAL | Status: DC | PRN

## 2015-03-08 ENCOUNTER — Other Ambulatory Visit: Payer: Self-pay

## 2015-03-08 DIAGNOSIS — M7062 Trochanteric bursitis, left hip: Principal | ICD-10-CM

## 2015-03-08 DIAGNOSIS — M7061 Trochanteric bursitis, right hip: Secondary | ICD-10-CM

## 2015-03-08 DIAGNOSIS — M76892 Other specified enthesopathies of left lower limb, excluding foot: Secondary | ICD-10-CM

## 2015-03-08 DIAGNOSIS — M76891 Other specified enthesopathies of right lower limb, excluding foot: Secondary | ICD-10-CM

## 2015-03-08 NOTE — Telephone Encounter (Signed)
Patient requesting refill. Last refilled on 02/13/15 #40 with 0 refills.

## 2015-03-09 NOTE — Telephone Encounter (Signed)
Pain should be much better by now. Please advise patient that if pain is not improving she will need referral to orthopedics.

## 2015-03-10 NOTE — Telephone Encounter (Signed)
Patient reports that she is not doing much better. She reports that the shot she received in the office only minimized her pain for about 5-7 days. She would be interested in seeing an orthopedic. However, she still would like something to help with the pain.

## 2015-03-11 ENCOUNTER — Ambulatory Visit (INDEPENDENT_AMBULATORY_CARE_PROVIDER_SITE_OTHER): Payer: 59 | Admitting: Family Medicine

## 2015-03-11 ENCOUNTER — Other Ambulatory Visit: Payer: Self-pay | Admitting: Family Medicine

## 2015-03-11 ENCOUNTER — Encounter: Payer: Self-pay | Admitting: Family Medicine

## 2015-03-11 VITALS — BP 108/70 | HR 85 | Temp 98.6°F | Resp 16 | Wt 194.8 lb

## 2015-03-11 DIAGNOSIS — J069 Acute upper respiratory infection, unspecified: Secondary | ICD-10-CM

## 2015-03-11 DIAGNOSIS — M7061 Trochanteric bursitis, right hip: Secondary | ICD-10-CM

## 2015-03-11 DIAGNOSIS — M7062 Trochanteric bursitis, left hip: Principal | ICD-10-CM

## 2015-03-11 MED ORDER — HYDROCODONE-ACETAMINOPHEN 5-325 MG PO TABS: 1.0000 | ORAL_TABLET | Freq: Four times a day (QID) | ORAL | Status: DC | PRN

## 2015-03-11 NOTE — Patient Instructions (Addendum)
Discussed use of Mucinex D for congestion, Delsym for cough, and Benadryl for postnasal drainage. Vicodin will help suppress the cough in your chest.

## 2015-03-11 NOTE — Progress Notes (Signed)
Subjective:     Patient ID: Holly GardenerStephanie L Austin, female   DOB: [birth year], 49 y.o.   MRN: [id number]  HPI  Chief Complaint  Patient presents with  . Sore Throat    Patient comes in office today with concerns of sore throat and cough for the past 3 days, patient reports that she has had post nasal drainage. Patient has been taking otc Alka-Seltzer cold/flu for relief.   Has decreased smoking while ill.   Review of Systems  Constitutional: Positive for chills. Negative for fever.       Objective:   Physical Exam  Constitutional: She appears well-developed and well-nourished. No distress.  Ears: T.M's intact without inflammation Throat: mild tonsillar enlargement and erythema, no exudate Neck: no cervical adenopathy Lungs: clear     Assessment:    1. Upper respiratory infection    Plan:    Discussed use of Mucinex D for congestion, Delsym for cough, and Benadryl for postnasal drainage. Will use previously rx'ed hydrocodone for cough control. Work excuse for 12/1-2 (working at [employer]).

## 2015-03-11 NOTE — Telephone Encounter (Signed)
Please refer orthopedics for persistent hip pain.

## 2015-03-12 NOTE — Telephone Encounter (Signed)
Please refer to ortho. Thanks.  

## 2015-03-18 ENCOUNTER — Other Ambulatory Visit: Payer: Self-pay | Admitting: Family Medicine

## 2015-03-18 ENCOUNTER — Telehealth: Payer: Self-pay | Admitting: Family Medicine

## 2015-03-18 DIAGNOSIS — R059 Cough, unspecified: Secondary | ICD-10-CM

## 2015-03-18 DIAGNOSIS — R05 Cough: Secondary | ICD-10-CM

## 2015-03-18 DIAGNOSIS — J019 Acute sinusitis, unspecified: Secondary | ICD-10-CM

## 2015-03-18 MED ORDER — BENZONATATE 100 MG PO CAPS
ORAL_CAPSULE | ORAL | Status: DC
Start: 2015-03-18 — End: 2016-01-12

## 2015-03-18 MED ORDER — DOXYCYCLINE HYCLATE 100 MG PO TABS: 100.0000 mg | ORAL_TABLET | Freq: Two times a day (BID) | ORAL | Status: DC

## 2015-03-18 NOTE — Telephone Encounter (Signed)
Hydrocodone prescribed by Dr. Sherrie MustacheFisher is same as the cough syrup. Use them for cough or use Delsym.

## 2015-03-18 NOTE — Telephone Encounter (Signed)
Pt states she was here a week ago for sinus congestion and cough.  Pt states she is not any better.  Pt is stilling has sinus congestion and cough. Pt is requesting a Rx to help with this.  [facility] [address]   CB#[id number]/MW

## 2015-03-18 NOTE — Telephone Encounter (Signed)
We can try Jerilynn Somessalon Perles which I will send in for you.

## 2015-03-18 NOTE — Telephone Encounter (Signed)
Advised patient who agreed to try medication. KW

## 2015-03-18 NOTE — Telephone Encounter (Signed)
Please review-aa 

## 2015-03-18 NOTE — Telephone Encounter (Signed)
I have sent in doxycycline. 

## 2015-03-18 NOTE — Telephone Encounter (Signed)
Patient states that she is taking Hydrocodone and tried otc cough syrup and she said none of it is helping with cough. Please advise. KW

## 2015-03-18 NOTE — Telephone Encounter (Signed)
Patient states can she get something for cough-dry cough mainly, gotten worse and she is unable to sleep well. pleae review-aa

## 2016-01-09 ENCOUNTER — Emergency Department (HOSPITAL_COMMUNITY)
Admission: EM | Admit: 2016-01-09 | Discharge: 2016-01-10 | Disposition: A | Discharge status: Other Acute Inpatient Hospital | Payer: 59 | Attending: Emergency Medicine | Admitting: Emergency Medicine

## 2016-01-09 ENCOUNTER — Encounter (HOSPITAL_COMMUNITY): Payer: Self-pay | Admitting: Emergency Medicine

## 2016-01-09 DIAGNOSIS — F1011 Alcohol abuse, in remission: Secondary | ICD-10-CM | POA: Diagnosis present

## 2016-01-09 DIAGNOSIS — F322 Major depressive disorder, single episode, severe without psychotic features: Secondary | ICD-10-CM | POA: Diagnosis present

## 2016-01-09 DIAGNOSIS — Z5181 Encounter for therapeutic drug level monitoring: Secondary | ICD-10-CM | POA: Insufficient documentation

## 2016-01-09 DIAGNOSIS — F17210 Nicotine dependence, cigarettes, uncomplicated: Secondary | ICD-10-CM | POA: Insufficient documentation

## 2016-01-09 DIAGNOSIS — F10120 Alcohol abuse with intoxication, uncomplicated: Secondary | ICD-10-CM | POA: Insufficient documentation

## 2016-01-09 DIAGNOSIS — F1994 Other psychoactive substance use, unspecified with psychoactive substance-induced mood disorder: Secondary | ICD-10-CM

## 2016-01-09 DIAGNOSIS — F10929 Alcohol use, unspecified with intoxication, unspecified: Secondary | ICD-10-CM

## 2016-01-09 DIAGNOSIS — J45909 Unspecified asthma, uncomplicated: Secondary | ICD-10-CM | POA: Insufficient documentation

## 2016-01-09 HISTORY — DX: Depression, unspecified: F32.A

## 2016-01-09 HISTORY — DX: Major depressive disorder, single episode, unspecified: F32.9

## 2016-01-09 LAB — CBG MONITORING, ED: GLUCOSE-CAPILLARY: 105 mg/dL — AB (ref 65–99)

## 2016-01-09 LAB — COMPREHENSIVE METABOLIC PANEL
ALBUMIN: 4.1 g/dL (ref 3.5–5.0)
ALT: 20 U/L (ref 14–54)
ANION GAP: 6 (ref 5–15)
AST: 19 U/L (ref 15–41)
Alkaline Phosphatase: 50 U/L (ref 38–126)
BILIRUBIN TOTAL: 0.5 mg/dL (ref 0.3–1.2)
BUN: 15 mg/dL (ref 6–20)
CHLORIDE: 111 mmol/L (ref 101–111)
CO2: 24 mmol/L (ref 22–32)
Calcium: 8.8 mg/dL — ABNORMAL LOW (ref 8.9–10.3)
Creatinine, Ser: 0.59 mg/dL (ref 0.44–1.00)
GFR calc Af Amer: >60 mL/min (ref >60)
GFR calc non Af Amer: >60 mL/min (ref >60)
GLUCOSE: 99 mg/dL (ref 65–99)
POTASSIUM: 3.5 mmol/L (ref 3.5–5.1)
SODIUM: 141 mmol/L (ref 135–145)
TOTAL PROTEIN: 7.4 g/dL (ref 6.5–8.1)

## 2016-01-09 LAB — CBC
HEMATOCRIT: 44.7 % (ref 36.0–46.0)
Hemoglobin: 15.1 g/dL — ABNORMAL HIGH (ref 12.0–15.0)
MCH: 31.9 pg (ref 26.0–34.0)
MCHC: 33.8 g/dL (ref 30.0–36.0)
MCV: 94.3 fL (ref 78.0–100.0)
Platelets: 255 10*3/uL (ref 150–400)
RBC: 4.74 MIL/uL (ref 3.87–5.11)
RDW: 12.8 % (ref 11.5–15.5)
WBC: 10.8 10*3/uL — AB (ref 4.0–10.5)

## 2016-01-09 LAB — RAPID URINE DRUG SCREEN, HOSP PERFORMED
Amphetamines: NOT DETECTED
BENZODIAZEPINES: NOT DETECTED
Barbiturates: NOT DETECTED
Cocaine: NOT DETECTED
Opiates: NOT DETECTED
Tetrahydrocannabinol: NOT DETECTED

## 2016-01-09 LAB — ACETAMINOPHEN LEVEL

## 2016-01-09 LAB — ETHANOL: Alcohol, Ethyl (B): 212 mg/dL — ABNORMAL HIGH (ref <5)

## 2016-01-09 LAB — SALICYLATE LEVEL: Salicylate Lvl: <4 mg/dL (ref 2.8–30.0)

## 2016-01-09 LAB — MAGNESIUM: Magnesium: 2.3 mg/dL (ref 1.7–2.4)

## 2016-01-09 LAB — POC URINE PREG, ED: PREG TEST UR: NEGATIVE

## 2016-01-09 MED ORDER — POTASSIUM CHLORIDE 10 MEQ/100ML IV SOLN
10.0000 meq | INTRAVENOUS | Status: AC
  Administered 2016-01-09: 10 meq via INTRAVENOUS
  Filled 2016-01-09 (×2): qty 100

## 2016-01-09 NOTE — ED Notes (Signed)
Holly CopasKeith Tracey (Son)  [phone]
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
## 2016-01-09 NOTE — ED Notes (Signed)
Bed: NF62WA04 Expected date:  Expected time:  Means of arrival:  Comments: EMS 50 yo female overdose

## 2016-01-09 NOTE — ED Notes (Signed)
Per poison control, recommend to check potassium and magnesium and optimize it to the high side of normal.  Wait to do a 4 hour tylenol level at 0015.  Keep patient well hydrated and use benzo's as needed.  Keep patient minimum 6 hours just based on what medication we know.
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
## 2016-01-09 NOTE — ED Notes (Signed)
RN will start an IV line 

## 2016-01-10 ENCOUNTER — Inpatient Hospital Stay (HOSPITAL_COMMUNITY)
Admission: EM | Admit: 2016-01-10 | Discharge: 2016-01-11 | DRG: 885 | Disposition: A | Discharge status: Home / Self Care | Payer: 59 | Source: Intra-hospital | Attending: Psychiatry | Admitting: Psychiatry

## 2016-01-10 DIAGNOSIS — F322 Major depressive disorder, single episode, severe without psychotic features: Secondary | ICD-10-CM | POA: Diagnosis present

## 2016-01-10 DIAGNOSIS — Z8249 Family history of ischemic heart disease and other diseases of the circulatory system: Secondary | ICD-10-CM

## 2016-01-10 DIAGNOSIS — Z79899 Other long term (current) drug therapy: Secondary | ICD-10-CM | POA: Diagnosis not present

## 2016-01-10 DIAGNOSIS — Z91040 Latex allergy status: Secondary | ICD-10-CM | POA: Diagnosis not present

## 2016-01-10 DIAGNOSIS — Z880 Allergy status to penicillin: Secondary | ICD-10-CM

## 2016-01-10 DIAGNOSIS — F17210 Nicotine dependence, cigarettes, uncomplicated: Secondary | ICD-10-CM | POA: Diagnosis present

## 2016-01-10 DIAGNOSIS — F1011 Alcohol abuse, in remission: Secondary | ICD-10-CM | POA: Diagnosis present

## 2016-01-10 DIAGNOSIS — F4323 Adjustment disorder with mixed anxiety and depressed mood: Secondary | ICD-10-CM | POA: Diagnosis not present

## 2016-01-10 DIAGNOSIS — Z808 Family history of malignant neoplasm of other organs or systems: Secondary | ICD-10-CM

## 2016-01-10 LAB — SALICYLATE LEVEL

## 2016-01-10 LAB — ACETAMINOPHEN LEVEL

## 2016-01-10 MED ORDER — PNEUMOCOCCAL VAC POLYVALENT 25 MCG/0.5ML IJ INJ
0.5000 mL | INJECTION | INTRAMUSCULAR | Status: AC
  Administered 2016-01-11: 0.5 mL via INTRAMUSCULAR

## 2016-01-10 MED ORDER — HALOPERIDOL LACTATE 5 MG/ML IJ SOLN
5.0000 mg | Freq: Once | INTRAMUSCULAR | Status: AC
  Administered 2016-01-10: 5 mg via INTRAVENOUS
  Filled 2016-01-10: qty 1

## 2016-01-10 MED ORDER — LORAZEPAM 1 MG PO TABS: 1.0000 mg | ORAL_TABLET | Freq: Every day | ORAL | Status: DC

## 2016-01-10 MED ORDER — VITAMIN B-1 100 MG PO TABS
100.0000 mg | ORAL_TABLET | Freq: Every day | ORAL | Status: DC
  Administered 2016-01-10 – 2016-01-11 (×2): 100 mg via ORAL
  Filled 2016-01-10 (×5): qty 1

## 2016-01-10 MED ORDER — LORAZEPAM 1 MG PO TABS
1.0000 mg | ORAL_TABLET | ORAL | Status: DC | PRN
Start: 2016-01-10 — End: 2016-01-12

## 2016-01-10 MED ORDER — HYDROXYZINE HCL 25 MG PO TABS: 25.0000 mg | ORAL_TABLET | Freq: Four times a day (QID) | ORAL | Status: DC | PRN

## 2016-01-10 MED ORDER — ALUM & MAG HYDROXIDE-SIMETH 200-200-20 MG/5ML PO SUSP: 30.0000 mL | ORAL | Status: DC | PRN

## 2016-01-10 MED ORDER — LOPERAMIDE HCL 2 MG PO CAPS: 2.0000 mg | ORAL_CAPSULE | ORAL | Status: DC | PRN

## 2016-01-10 MED ORDER — LORAZEPAM 1 MG PO TABS: 0.0000 mg | ORAL_TABLET | Freq: Two times a day (BID) | ORAL | Status: DC

## 2016-01-10 MED ORDER — ONDANSETRON 4 MG PO TBDP: 4.0000 mg | ORAL_TABLET | Freq: Four times a day (QID) | ORAL | Status: DC | PRN

## 2016-01-10 MED ORDER — MAGNESIUM HYDROXIDE 400 MG/5ML PO SUSP: 30.0000 mL | Freq: Every day | ORAL | Status: DC | PRN

## 2016-01-10 MED ORDER — LORAZEPAM 1 MG PO TABS: 1.0000 mg | ORAL_TABLET | Freq: Two times a day (BID) | ORAL | Status: DC

## 2016-01-10 MED ORDER — ACETAMINOPHEN 325 MG PO TABS: 650.0000 mg | ORAL_TABLET | Freq: Four times a day (QID) | ORAL | Status: DC | PRN

## 2016-01-10 MED ORDER — LORAZEPAM 2 MG/ML IJ SOLN
2.0000 mg | Freq: Once | INTRAMUSCULAR | Status: AC
  Administered 2016-01-10: 2 mg via INTRAVENOUS
  Filled 2016-01-10: qty 1

## 2016-01-10 MED ORDER — LORAZEPAM 1 MG PO TABS: 0.0000 mg | ORAL_TABLET | Freq: Four times a day (QID) | ORAL | Status: DC

## 2016-01-10 MED ORDER — DIPHENHYDRAMINE HCL 50 MG/ML IJ SOLN
25.0000 mg | Freq: Once | INTRAMUSCULAR | Status: AC
  Administered 2016-01-10: 25 mg via INTRAVENOUS
  Filled 2016-01-10: qty 1

## 2016-01-10 MED ORDER — LORAZEPAM 1 MG PO TABS: 1.0000 mg | ORAL_TABLET | Freq: Three times a day (TID) | ORAL | Status: DC

## 2016-01-10 MED ORDER — LORAZEPAM 1 MG PO TABS: 1.0000 mg | ORAL_TABLET | Freq: Four times a day (QID) | ORAL | Status: DC

## 2016-01-10 MED ORDER — ADULT MULTIVITAMIN W/MINERALS CH
1.0000 | ORAL_TABLET | Freq: Every day | ORAL | Status: DC
  Administered 2016-01-10 – 2016-01-11 (×2): 1 via ORAL
  Filled 2016-01-10 (×5): qty 1

## 2016-01-10 MED ORDER — INFLUENZA VAC SPLIT QUAD 0.5 ML IM SUSY
0.5000 mL | PREFILLED_SYRINGE | INTRAMUSCULAR | Status: AC
  Administered 2016-01-11: 0.5 mL via INTRAMUSCULAR
  Filled 2016-01-10: qty 0.5

## 2016-01-10 NOTE — Progress Notes (Signed)
Patient ID: Holly GardenerStephanie L Sauerwein, female   DOB: 1966/02/06, 50 y.o.   MRN: [id number]    Pt currently presents with a flat affect and guarded behavior. Interaction with patient minimal. Pt sitting in bed, denies any discomfort or pain. Minimizes situation that led her to be at the hospital, requests an extra blanket.   Pt provided with medications per providers orders. Pt's labs and vitals were monitored throughout the night. Pt supported emotionally and encouraged to express concerns and questions. Pt educated on medications.  Pt's safety ensured with 15 minute and environmental checks. Pt currently denies SI/HI and A/V hallucinations. Pt verbally agrees to seek staff if SI/HI or A/VH occurs and to consult with staff before acting on any harmful thoughts. Will continue POC.

## 2016-01-10 NOTE — BH Assessment (Signed)
[facility] Assessment Progress Note  Pt's nurse, Morrie Sheldonshley, has been notified of pt's acceptance to 306[facility].  [facility].  [facility] [place] [facility]  Doylene Canninghomas Micaylah Bertucci, MA Triage Specialist [phone]

## 2016-01-10 NOTE — Tx Team (Addendum)
Initial Treatment Plan 01/10/2016 2:19 PM Alex GardenerStephanie L Burruss OZH:[id number]    PATIENT STRESSORS: Financial difficulties   PATIENT STRENGTHS: Ability for insight Capable of independent living Communication skills General fund of knowledge   PATIENT IDENTIFIED PROBLEMS: "I was so depressed I can't find a job I didn't know what to do" - depression  Patient took a large quantity of Xanax and Tylenol in a suicide attempt. - suicide risk                   DISCHARGE CRITERIA:  Improved stabilization in mood, thinking, and/or behavior Reduction of life-threatening or endangering symptoms to within safe limits Safe-care adequate arrangements made  PRELIMINARY DISCHARGE PLAN: Return to previous living arrangement  PATIENT/FAMILY INVOLVEMENT: This treatment plan has been presented to and reviewed with the patient, Alex GardenerStephanie L Jersey, and/or family member.  The patient and family have been given the opportunity to ask questions and make suggestions.  Jerrye BushyLaRonica R Waller, RN 01/10/2016, 2:19 PM

## 2016-01-10 NOTE — Progress Notes (Signed)
Recreation Therapy Notes  Animal-Assisted Activity (AAA) Program Checklist/Progress Notes Patient Eligibility Criteria Checklist & Daily Group note for Rec TxIntervention  Date: 10.03.2017 Time: 2:45pm [facility] [address]    AAA/T Program Assumption of Risk Form signed by Patient/ or Parent Legal Guardian Yes  Patient is free of allergies or sever asthma Yes  Patient reports no fear of animals Yes  Patient reports no history of cruelty to animals Yes  Patient understands his/her participation is voluntary Yes  Behavioral Response: Did not attend.   Holly Austin, LRT/CTRS        Aune Adami L 01/10/2016 3:01 PM

## 2016-01-10 NOTE — ED Notes (Signed)
[facility]. Pt signed for personal belongings and property given to sheriff for transport. Transfer form given to sheriff. Pt ambulatory off unit.
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
## 2016-01-10 NOTE — ED Notes (Signed)
Pt under IVC, presents with ingestion of Tylenol, Xanax and alcohol. SI, denies HI, or AVH.  Feeling hopeless.  Pt reports she is experiencing marital and family problems.  Pt reports she attempted SI, 1 year ago by same method.  Alcohol level 212.  A&O x 3, no distress noted, calm & cooperative at present.  Monitoring for safety, Q 15 min checks in effect.

## 2016-01-10 NOTE — ED Notes (Signed)
PC updated.  Will call back in an hour to check on new acetaminophen and salicylate levels.

## 2016-01-10 NOTE — BH Assessment (Signed)
Ronnell FreshwaterLatricia L London, RN advised of disposition recommendation and [facility] Assessor attempted to have the pt complete the voluntary consent for admission but the pt was sleeping and unable to be aroused. Will leave report for the next shift to complete the voluntary admission consent form.  Princess BruinsAquicha Duff, MSW, Theresia MajorsLCSWA

## 2016-01-10 NOTE — ED Notes (Signed)
PT SAFETY SCREENED FOR [facility]AND UPON ARRIVAL TO [facility].

## 2016-01-10 NOTE — BHH Suicide Risk Assessment (Signed)
[facility]   Nursing information obtained from:  Patient Demographic factors:  Low socioeconomic status Current Mental Status:  NA Loss Factors:  Financial problems / change in socioeconomic status Historical Factors:  Impulsivity Risk Reduction Factors:  Responsible for children under 48 years of age  Total Time spent with patient: 30 minutes Principal Problem: <principal problem not specified> Diagnosis:   Patient Active Problem List   Diagnosis Date Noted  . Alcohol abuse [F10.10] 01/10/2016  . Major depressive disorder, single episode, severe without psychotic features (HCC) [F32.2] 01/10/2016  . Endometriosis [N80.9] 02/01/2015  . External hemorrhoids [K64.4] 02/01/2015  . TROCHANTERIC BURSITIS, RIGHT [X32.440][M76.899] 06/28/2010  . 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 PLANTAR WART [B07.0] 04/11/2010  . CIGARETTE SMOKER [F17.200] 01/20/2010  . HEADACHE [R51] 09/25/2009  . Asthma, extrinsic [J45.909] 04/18/2009  . LBP (low back pain) [M54.5] 07/02/2008  . Anxiety disorder [F41.9] 01/14/2008  . Acute onset aura migraine [G43.109] 10/10/2007  . Allergic rhinitis [J30.9] 08/21/2006   Subjective Data: Denies current suicidal or homicidal ideation, plan or intent.  Continued Clinical Symptoms:  Alcohol Use Disorder Identification Test Final Score (AUDIT): 4 The "Alcohol Use Disorders Identification Test", Guidelines for Use in Primary Care, Second Edition.  World Science writerHealth Organization [facility](WHO). Score between 0-7:  no or low risk or alcohol related problems. Score between 8-15:  moderate risk of alcohol related problems. Score between 16-19:  high risk of alcohol related problems. Score 20 or above:  warrants further diagnostic evaluation for alcohol dependence and treatment.   CLINICAL FACTORS:   Medical Diagnoses and Treatments/Surgeries   Musculoskeletal: Strength & Muscle Tone: within normal limits Gait & Station: normal Patient leans:  N/A  Psychiatric Specialty Exam: Physical Exam  ROS  Blood pressure 112/83, pulse (!) 105, temperature 98.1 F (36.7 C), temperature source Oral, resp. rate 16, height 5\' 10"  (1.778 m), weight 88.5 kg (195 lb), SpO2 97 %.Body mass index is 27.98 kg/m.  General Appearance: Casual  Eye Contact:  Good  Speech:  Clear and Coherent  Volume:  Normal  Mood:  Anxious  Affect:  Congruent  Thought Process:  Coherent  Orientation:  Full (Time, Place, and Person)  Thought Content:  Negative  Suicidal Thoughts:  No  Homicidal Thoughts:  No  Memory:  Negative  Judgement:  Fair  Insight:  Fair  Psychomotor Activity:  Normal  Concentration:  Concentration: Good and Attention Span: Good  Recall:  Good  Fund of Knowledge:  Good  Language:  Good  Akathisia:  No  Handed:  Right  AIMS (if indicated):     Assets:  Physical Health Vocational/Educational  ADL's:  Intact  Cognition:  WNL  Sleep:         COGNITIVE FEATURES THAT CONTRIBUTE TO RISK:  None    SUICIDE RISK:   Mild:  Suicidal ideation of limited frequency, intensity, duration, and specificity.  There are no identifiable plans, no associated intent, mild dysphoria and related symptoms, good self-control (both objective and subjective assessment), few other risk factors, and identifiable protective factors, including available and accessible social support.   PLAN OF CARE: see PAA  I certify that inpatient services furnished can reasonably be expected to improve the patient's condition.  Acquanetta SitElizabeth Woods Lacy Taglieri, MD 01/10/2016, 2:45 PM

## 2016-01-10 NOTE — ED Notes (Signed)
[facility], Dr Jama Flavorsobos after 9am.

## 2016-01-10 NOTE — Progress Notes (Signed)
Patient ID: Alex GardenerStephanie L Ringer, female   DOB: [birth year], 50 y.o.   MRN: [id number] Patient was admitted to behavioral health hospital due to admitted suicide attempt.  Upon admission patient minimized suicide attempt stating "It was a stupid mistake that I will never do again.  I don't really want to kill myself I as just overwhelmed about not having a job."  Patient began to ask, how long will I be here.  I need to be out of here by tomorrow. I have a job interview. Patient was educated on the seriousness of a suicide attempt and about the admission and treatment process.  Patient was remorseful about suicide attempt and denies SI at this time.  Patient was noted to be flat in affect and slow in movement.    Upon admission patient's skin assessment was complete and she was noted to have a tattoo of an open heart on her left arm but no other remarkable findings.    Patient oriented to the unit without any complications.
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
## 2016-01-11 MED ORDER — HYDROXYZINE HCL 25 MG PO TABS: 25.0000 mg | ORAL_TABLET | Freq: Four times a day (QID) | ORAL | 0 refills | Status: DC | PRN

## 2016-01-11 NOTE — [facility] Group Notes (Signed)
[facility]  01/11/2016 3:27 PM  Type of Therapy:  Group Therapy  Participation Level:  Did Not Attend    Raye SorrowCoble, Lennon Boutwell N 01/11/2016, 3:27 PM
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
## 2016-01-11 NOTE — Progress Notes (Signed)
Data. Patient denies SI/HI/AVH. Patient interacting well with staff and other patients.Affect bright on approach. Patient reports being, "happy and excited that I finally get to leave". On her self assessment patient reports 0/10 for anxiety, depression and hopelessness. She has no goal for today.   Action. Flu and Pneumonia IMs given. Emotional support and encouragement offered. Education provided on medication, indications and side effect. Q 15 minute checks done for safety. Response. Safety on the unit maintained through 15 minute checks.  Medications taken as prescribed. Attended groups. Remained calm and appropriate through out shift.  Pt. discharged to lobby.  Belongings sheet reviewed and signed by pt. and all belongings, including one script,  sent home. Paperwork reviewed and pt. able to verbalize understanding of education. Pt. in no current distress and ambulatory.
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
## 2016-01-11 NOTE — Progress Notes (Signed)
No PSA completed at this time due to number of hours in hospital and patient being discharged prior to assessment.  Patient also refused follow up.  Deretha EmoryHannah Maryjo Ragon LCSW, MSW Clinical Social Work: Optician, dispensingystem Wide Float Coverage for :  [employer]

## 2016-01-11 NOTE — Progress Notes (Signed)
Recreation Therapy Notes  Date: 01/11/16 Time: 0930 Location: 300 Hall Group Room  Group Topic: Stress Management  Goal Area(s) Addresses:  Patient will verbalize importance of using healthy stress management.  Patient will identify positive emotions associated with healthy stress management.   Intervention: Stress Management  Activity :  Progressive Muscle Relaxation.  LRT introduced the technique of progressive muscle relaxation to the group.  LRT read script to engage patients in the technique.  Patients were to follow along as LRT read Austin script to participate in activity.  Education:  Stress Management, Discharge Planning.   Education Outcome: Acknowledges edcuation/In group clarification offered/Needs additional education  Clinical Observations/Feedback: Pt did not attend group.     Holly Austin, LRT/CTRS         Holly Austin 01/11/2016 11:55 AM 

## 2016-01-11 NOTE — [facility] Group Notes (Signed)
Patient did not attend group.

## 2016-01-11 NOTE — Progress Notes (Signed)
  [facility] :  Will you be returning to the same living situation after discharge:  Yes,  return home with spouse At discharge, do you have transportation home?: Yes,  family Do you have the ability to pay for your medications: Yes,  no barriers  Release of information consent forms completed and in the chart;  Patient's signature needed at discharge.  Patient to Follow up at:  Refused at this time   Next level of care provider has [facility] Link:yes  Safety Planning and Suicide Prevention discussed: No.  Have you used any form of tobacco in the last 30 days? (Cigarettes, Smokeless Tobacco, Cigars, and/or Pipes): Yes  Has patient been referred to the Quitline?: Patient refused referral  Patient has been referred for addiction treatment: Pt. refused referral  Raye SorrowCoble, Pamala Hayman N 01/11/2016, 10:36 AM

## 2016-01-12 NOTE — [facility] Counselor (Signed)
Late Entry for CSW [place]  No PSA, less than 72 hour admit.  Santa GeneraAnne Cunningham, LCSW Lead Clinical Social Worker Phone:  [phone]

## 2016-02-03 ENCOUNTER — Encounter: Payer: Self-pay | Admitting: Family Medicine

## 2016-02-03 ENCOUNTER — Ambulatory Visit (INDEPENDENT_AMBULATORY_CARE_PROVIDER_SITE_OTHER): Payer: Self-pay | Admitting: Family Medicine

## 2016-02-03 VITALS — BP 122/80 | HR 80 | Temp 97.7°F | Resp 16 | Wt 204.8 lb

## 2016-02-03 DIAGNOSIS — F419 Anxiety disorder, unspecified: Secondary | ICD-10-CM

## 2016-02-03 MED ORDER — HYDROXYZINE HCL 25 MG PO TABS: 25.0000 mg | ORAL_TABLET | Freq: Four times a day (QID) | ORAL | 0 refills | Status: DC | PRN

## 2016-02-03 NOTE — Patient Instructions (Signed)
Please consider AA -start online. Do access [facility] for further counseling needs. Let me know how the medication is doing at time for refill.
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
## 2016-06-22 ENCOUNTER — Encounter: Payer: Self-pay | Admitting: Family Medicine

## 2016-06-22 ENCOUNTER — Ambulatory Visit (INDEPENDENT_AMBULATORY_CARE_PROVIDER_SITE_OTHER): Payer: 59 | Admitting: Family Medicine

## 2016-06-22 VITALS — BP 108/80 | HR 96 | Temp 98.3°F | Resp 16 | Wt 198.0 lb

## 2016-06-22 DIAGNOSIS — J101 Influenza due to other identified influenza virus with other respiratory manifestations: Secondary | ICD-10-CM | POA: Diagnosis not present

## 2016-06-22 DIAGNOSIS — R059 Cough, unspecified: Secondary | ICD-10-CM

## 2016-06-22 DIAGNOSIS — R05 Cough: Secondary | ICD-10-CM

## 2016-06-22 LAB — POCT INFLUENZA A/B
INFLUENZA B, POC: POSITIVE — AB
Influenza A, POC: NEGATIVE

## 2016-06-22 MED ORDER — HYDROCODONE-HOMATROPINE 5-1.5 MG/5ML PO SYRP
5.0000 mL | ORAL_SOLUTION | Freq: Three times a day (TID) | ORAL | 0 refills | Status: DC | PRN
Start: 2016-06-22 — End: 2016-11-13

## 2016-06-22 MED ORDER — OSELTAMIVIR PHOSPHATE 75 MG PO CAPS: 75.0000 mg | ORAL_CAPSULE | Freq: Two times a day (BID) | ORAL | 0 refills | Status: DC

## 2016-06-22 NOTE — Progress Notes (Signed)
Patient: Holly Austin Female    DOB: 28-Jan-1966   51 y.o.   MRN: [id number] Visit Date: 06/22/2016  Today's Provider: Dortha Kern, PA   Chief Complaint  Patient presents with  . URI   Subjective:    URI   This is a new problem. The current episode started in the past 7 days (x 3 days). The problem has been gradually worsening. The maximum temperature recorded prior to her arrival was 102 - 102.9 F. Associated symptoms include abdominal pain (due to excessive coughing), congestion, coughing (mostly dry), diarrhea, ear pain (bilateral), headaches (sinus), neck pain, a plugged ear sensation, sinus pain, sneezing, a sore throat and swollen glands. Pertinent negatives include no chest pain, dysuria, nausea, rhinorrhea, vomiting or wheezing. Treatments tried: Alkaseltzer Cold and Flu. The treatment provided no relief.       Allergies  Allergen Reactions  . Penicillin G Hives and Shortness Of Breath    .Marland KitchenHas patient had a PCN reaction causing immediate rash, facial/tongue/throat swelling, SOB or lightheadedness with hypotension: Yes Has patient had a PCN reaction causing severe rash involving mucus membranes or skin necrosis: No Has patient had a PCN reaction that required hospitalization No Has patient had a PCN reaction occurring within the last 10 years: No If all of the above answers are "NO", then may proceed with Cephalosporin use.    . Latex Other (See Comments)    Blisters     Current Outpatient Prescriptions:  .  hydrOXYzine (ATARAX/VISTARIL) 25 MG tablet, Take 1 tablet (25 mg total) by mouth every 6 (six) hours as needed for anxiety (anxiety/agitation or CIWA < or = 10)., Disp: 120 tablet, Rfl: 0  Review of Systems  HENT: Positive for congestion, ear pain (bilateral), sinus pain, sneezing and sore throat. Negative for rhinorrhea.   Respiratory: Positive for cough (mostly dry). Negative for wheezing.   Cardiovascular: Negative for chest pain.    Gastrointestinal: Positive for abdominal pain (due to excessive coughing) and diarrhea. Negative for nausea and vomiting.  Genitourinary: Negative for dysuria.  Musculoskeletal: Positive for neck pain.  Neurological: Positive for headaches (sinus).    Social History  Substance Use Topics  . Smoking status: Current Every Day Smoker    Packs/day: 1.00    Years: 25.00    Types: Cigarettes  . Smokeless tobacco: Never Used  . Alcohol use Yes     Comment: few days a week    Objective:   BP 108/80 (BP Location: Left Arm, Patient Position: Sitting, Cuff Size: Large)   Pulse 96   Temp 98.3 F (36.8 C) (Oral)   Resp 16   Wt 198 lb (89.8 kg)   SpO2 99%   BMI 28.41 kg/m  Vitals:   06/22/16 0946  BP: 108/80  Pulse: 96  Resp: 16  Temp: 98.3 F (36.8 C)  TempSrc: Oral  SpO2: 99%  Weight: 198 lb (89.8 kg)     Physical Exam  Constitutional: She is oriented to person, place, and time. She appears well-developed and well-nourished.  HENT:  Head: Normocephalic.  Right Ear: External ear normal.  Left Ear: External ear normal.  Red posterior pharynx without exudates.  Eyes: Conjunctivae are normal.  Neck: Neck supple.  Cardiovascular: Normal rate and regular rhythm.   Pulmonary/Chest: Effort normal and breath sounds normal.  Abdominal: Soft. Bowel sounds are normal.  Lymphadenopathy:    She has no cervical adenopathy.  Neurological: She is alert and oriented to person, place, and time.  Assessment & Plan:     1. Cough Onset 3 days ago with fever up to 102-102.9. No sputum production. Positive flu test. Will treat with cough syrup and home to rest. Out of work 06-20-16 through 06-24-16. Recheck prn. - POCT Influenza A/B - HYDROcodone-homatropine (HYCODAN) 5-1.5 MG/5ML syrup; Take 5 mLs by mouth every 8 (eight) hours as needed for cough.  Dispense: 120 mL; Refill: 0  2. Influenza B Onset 3 days ago with cough, sore throat and stuffy head with headache and fever. Positive  flu test for influenza B. Will treat with Tamiflu and Hycodan. May use Tylenol or Advil prn aches, pains or fever. Increase fluid intake and recheck prn. - oseltamivir (TAMIFLU) 75 MG capsule; Take 1 capsule (75 mg total) by mouth 2 (two) times daily.  Dispense: 10 capsule; Refill: 0     Patient seen and examined by Dortha Kernennis Chrismon, PA, and note scribed by Allene DillonEmily Drozdowski, CMA.  Dortha Kernennis Chrismon, PA  [facility]

## 2016-06-22 NOTE — Patient Instructions (Signed)

## 2016-11-13 ENCOUNTER — Other Ambulatory Visit: Payer: Self-pay | Admitting: Family Medicine

## 2016-11-13 ENCOUNTER — Ambulatory Visit (INDEPENDENT_AMBULATORY_CARE_PROVIDER_SITE_OTHER): Payer: 59 | Admitting: Family Medicine

## 2016-11-13 ENCOUNTER — Encounter: Payer: Self-pay | Admitting: Family Medicine

## 2016-11-13 VITALS — BP 122/86 | HR 78 | Temp 98.1°F | Resp 16 | Wt 188.6 lb

## 2016-11-13 DIAGNOSIS — J069 Acute upper respiratory infection, unspecified: Secondary | ICD-10-CM

## 2016-11-13 MED ORDER — HYDROCODONE-HOMATROPINE 5-1.5 MG/5ML PO SYRP: ORAL_SOLUTION | ORAL | 0 refills | Status: DC

## 2016-11-13 NOTE — Patient Instructions (Signed)
Discussed use of Mucinex D for congestion. Let me know if your sinuses are not clearing by the end of the week.

## 2016-11-13 NOTE — Progress Notes (Signed)
Subjective:     Patient ID: Holly GardenerStephanie L Austin, female   DOB: 08/26/1965, 51 y.o.   MRN: [id number]  HPI  Chief Complaint  Patient presents with  . Cough    Patient comes in office today with concerns of cough and shortness of breath since 11/08/16. Patient reports associated with cough she has had bilateral ear pain, sinus pain/pressure and congestion. Patient has been taking otc Mucinex DM for relief.   Reports PND is contributing to cough. Currently working at [employer]. Living with her husband but they are estranged. States she has stopped drinking alcohol.   Review of Systems  Constitutional: Negative for chills and fever.  Psychiatric/Behavioral:       Reports weekly panic attacks controlled by hydroxyzine.       Objective:   Physical Exam  Constitutional: She appears well-developed and well-nourished. No distress.  Ears: T.M's intact without inflammation Sinuses: non-tender Throat: no tonsillar enlargement or exudate Neck: no cervical adenopathy Lungs: clear     Assessment:    1. Upper respiratory tract infection, unspecified type - HYDROcodone-homatropine (HYCODAN) 5-1.5 MG/5ML syrup; 5 ml 4-6 hours as needed for cough  Dispense: 240 mL; Refill: 0    Plan:    Discussed use of Mucinex D or congestion.

## 2016-11-19 ENCOUNTER — Other Ambulatory Visit: Payer: Self-pay | Admitting: Family Medicine

## 2016-11-19 ENCOUNTER — Telehealth: Payer: Self-pay | Admitting: Family Medicine

## 2016-11-19 DIAGNOSIS — J069 Acute upper respiratory infection, unspecified: Secondary | ICD-10-CM

## 2016-11-19 MED ORDER — HYDROCODONE-HOMATROPINE 5-1.5 MG/5ML PO SYRP: ORAL_SOLUTION | ORAL | 0 refills | Status: DC

## 2016-11-19 MED ORDER — DOXYCYCLINE HYCLATE 100 MG PO TABS: 100.0000 mg | ORAL_TABLET | Freq: Two times a day (BID) | ORAL | 0 refills | Status: DC

## 2016-11-19 NOTE — Telephone Encounter (Signed)
Unable to reach patient at this time will try contacting again at a later time. KW

## 2016-11-19 NOTE — Telephone Encounter (Signed)
I have sent in doxycycline. Too early for hydrocodone cough syrup refill.

## 2016-11-19 NOTE — Telephone Encounter (Signed)
Pt states was seen on Tuesday for cough and congestion.  Pt is not any better and her head is not draining.  Pt is requesting a refill for the cough medication.  Pt is also asking if there is anything else she can get to help with this.  [facility] [address]  CB#[phone]/MW

## 2016-11-19 NOTE — Telephone Encounter (Signed)
Patient states that when she got prescription from pharmacy she spoke with a pharmacist named Melburn PopperMichelle Williams and she reports that pharmacist would only fill Hydrocodone for 5 days, patient is stating that you gave her enough to last 8 days but pharmacy only permitted 5 days.

## 2016-11-19 NOTE — Telephone Encounter (Signed)
Please review. KW 

## 2016-11-19 NOTE — Telephone Encounter (Signed)
I have put another rx of 150 ml of hydrocodone up front for pickup

## 2016-11-20 ENCOUNTER — Emergency Department: Payer: 59

## 2016-11-20 ENCOUNTER — Telehealth: Payer: Self-pay | Admitting: Emergency Medicine

## 2016-11-20 ENCOUNTER — Encounter: Payer: Self-pay | Admitting: *Deleted

## 2016-11-20 ENCOUNTER — Emergency Department
Admission: EM | Admit: 2016-11-20 | Discharge: 2016-11-20 | Disposition: A | Discharge status: Home / Self Care | Payer: 59 | Attending: Emergency Medicine | Admitting: Emergency Medicine

## 2016-11-20 DIAGNOSIS — R0602 Shortness of breath: Secondary | ICD-10-CM | POA: Insufficient documentation

## 2016-11-20 LAB — TROPONIN I: Troponin I: <0.03 ng/mL (ref <0.03)

## 2016-11-20 LAB — BASIC METABOLIC PANEL
Anion gap: 13 (ref 5–15)
BUN: 6 mg/dL (ref 6–20)
CALCIUM: 9.3 mg/dL (ref 8.9–10.3)
CO2: 17 mmol/L — ABNORMAL LOW (ref 22–32)
Chloride: 112 mmol/L — ABNORMAL HIGH (ref 101–111)
Creatinine, Ser: 0.84 mg/dL (ref 0.44–1.00)
GFR calc Af Amer: >60 mL/min (ref >60)
Glucose, Bld: 83 mg/dL (ref 65–99)
POTASSIUM: 2.9 mmol/L — AB (ref 3.5–5.1)
SODIUM: 142 mmol/L (ref 135–145)

## 2016-11-20 LAB — CBC
HEMATOCRIT: 45.6 % (ref 35.0–47.0)
Hemoglobin: 15.6 g/dL (ref 12.0–16.0)
MCH: 31.4 pg (ref 26.0–34.0)
MCHC: 34.2 g/dL (ref 32.0–36.0)
MCV: 91.8 fL (ref 80.0–100.0)
PLATELETS: 288 10*3/uL (ref 150–440)
RBC: 4.97 MIL/uL (ref 3.80–5.20)
RDW: 12.6 % (ref 11.5–14.5)
WBC: 11.6 10*3/uL — AB (ref 3.6–11.0)

## 2016-11-20 NOTE — ED Triage Notes (Signed)
Pt states she is having a hard time catching her breath.  Dx with URI last week.  No chest pain. cig smoker.  Pt has a cough.  No fever.  Pt alert.  Speech clear.

## 2016-11-20 NOTE — Telephone Encounter (Signed)
Called patient due to lwot to inquire about condition and follow up plans. Number disconnected 

## 2016-11-21 NOTE — Telephone Encounter (Signed)
Was unable to reach patient at this time will try contacting her again at a later time. KW

## 2016-11-22 ENCOUNTER — Other Ambulatory Visit: Payer: Self-pay | Admitting: Family Medicine

## 2016-11-22 DIAGNOSIS — J069 Acute upper respiratory infection, unspecified: Secondary | ICD-10-CM

## 2016-11-22 NOTE — Telephone Encounter (Signed)
Patient advised.KW 

## 2017-01-22 ENCOUNTER — Other Ambulatory Visit: Payer: Self-pay | Admitting: Family Medicine

## 2017-01-22 DIAGNOSIS — J069 Acute upper respiratory infection, unspecified: Secondary | ICD-10-CM

## 2017-01-23 ENCOUNTER — Encounter: Payer: Self-pay | Admitting: Family Medicine

## 2017-01-23 ENCOUNTER — Ambulatory Visit (INDEPENDENT_AMBULATORY_CARE_PROVIDER_SITE_OTHER): Payer: 59 | Admitting: Family Medicine

## 2017-01-23 VITALS — BP 110/84 | HR 74 | Temp 97.9°F | Resp 16 | Wt 186.0 lb

## 2017-01-23 DIAGNOSIS — J0100 Acute maxillary sinusitis, unspecified: Secondary | ICD-10-CM

## 2017-01-23 MED ORDER — HYDROCODONE-HOMATROPINE 5-1.5 MG/5ML PO SYRP: ORAL_SOLUTION | ORAL | 0 refills | Status: DC

## 2017-01-23 MED ORDER — ALBUTEROL SULFATE HFA 108 (90 BASE) MCG/ACT IN AERS: 2.0000 | INHALATION_SPRAY | Freq: Four times a day (QID) | RESPIRATORY_TRACT | 2 refills | Status: DC | PRN

## 2017-01-23 MED ORDER — DOXYCYCLINE HYCLATE 100 MG PO TABS: 100.0000 mg | ORAL_TABLET | Freq: Two times a day (BID) | ORAL | 0 refills | Status: DC

## 2017-01-23 NOTE — Patient Instructions (Signed)
Once you are better consider coming in for breathing tests/spirometry

## 2017-01-23 NOTE — Progress Notes (Signed)
Subjective:     Patient ID: Holly GardenerStephanie L Austin, female   DOB: [birth year], [age] y.o.   MRN: [id number]  HPI  Chief Complaint  Patient presents with  . URI    Patient comes in office with concerns of cough and sinus pain/pressure for two weeks. Patient reports that cough has been productive of mucous and she has had shortness of breath and wheezing. Associated symptoms with cough include, ear pain and congestion, patient has tried International aid/development workerotc Alka-Seltzer Day/Night.   Reports sinus congestion is locked in with very little drainage. Has decreased smoking since being ill.   Review of Systems     Objective:   Physical Exam  Constitutional: She appears well-developed and well-nourished. No distress.  Ears: T.M's intact without inflammation Sinuses: mild maxillary sinus tenderness Throat: no tonsillar enlargement or exudate Neck: no cervical adenopathy Lungs: clear     Assessment:    1. Acute non-recurrent maxillary sinusitis - doxycycline (VIBRA-TABS) 100 MG tablet; Take 1 tablet (100 mg total) by mouth 2 (two) times daily.  Dispense: 20 tablet; Refill: 0 - HYDROcodone-homatropine (HYCODAN) 5-1.5 MG/5ML syrup; 5 ml 4-6 hours as needed for cough  Dispense: 120 mL; Refill: 0 - albuterol (PROVENTIL HFA;VENTOLIN HFA) 108 (90 Base) MCG/ACT inhaler; Inhale 2 puffs into the lungs every 6 (six) hours as needed for wheezing or shortness of breath.  Dispense: 1 Inhaler; Refill: 2    Plan:    Consider spirometry once better from current illness.

## 2017-01-24 ENCOUNTER — Other Ambulatory Visit: Payer: Self-pay | Admitting: Family Medicine

## 2017-01-24 DIAGNOSIS — J0100 Acute maxillary sinusitis, unspecified: Secondary | ICD-10-CM

## 2017-01-24 MED ORDER — ALBUTEROL SULFATE HFA 108 (90 BASE) MCG/ACT IN AERS: 2.0000 | INHALATION_SPRAY | Freq: Four times a day (QID) | RESPIRATORY_TRACT | 2 refills | Status: DC | PRN

## 2017-01-29 ENCOUNTER — Other Ambulatory Visit: Payer: Self-pay | Admitting: Family Medicine

## 2017-01-29 DIAGNOSIS — J0100 Acute maxillary sinusitis, unspecified: Secondary | ICD-10-CM

## 2017-01-29 MED ORDER — HYDROCODONE-HOMATROPINE 5-1.5 MG/5ML PO SYRP: ORAL_SOLUTION | ORAL | 0 refills | Status: DC

## 2017-01-30 ENCOUNTER — Ambulatory Visit: Payer: Self-pay | Admitting: Family Medicine

## 2017-02-26 ENCOUNTER — Encounter: Payer: Self-pay | Admitting: Family Medicine

## 2017-02-26 ENCOUNTER — Ambulatory Visit: Payer: 59 | Admitting: Family Medicine

## 2017-02-26 DIAGNOSIS — M50122 Cervical disc disorder at C5-C6 level with radiculopathy: Secondary | ICD-10-CM | POA: Diagnosis not present

## 2017-02-26 DIAGNOSIS — Z23 Encounter for immunization: Secondary | ICD-10-CM | POA: Diagnosis not present

## 2017-02-26 MED ORDER — PREDNISONE 20 MG PO TABS: ORAL_TABLET | ORAL | 0 refills | Status: DC

## 2017-02-26 MED ORDER — HYDROCODONE-ACETAMINOPHEN 5-325 MG PO TABS: ORAL_TABLET | ORAL | 0 refills | Status: DC

## 2017-02-26 MED ORDER — CYCLOBENZAPRINE HCL 5 MG PO TABS: 5.0000 mg | ORAL_TABLET | Freq: Every day | ORAL | 0 refills | Status: DC

## 2017-02-26 NOTE — Patient Instructions (Signed)
Minimize lifting at work. If not improving call for referral.

## 2017-02-26 NOTE — Progress Notes (Signed)
Subjective:     Patient ID: Holly GardenerStephanie L Austin, female   DOB: [birth year], 51 y.o.   MRN: [id number] Chief Complaint  Patient presents with  . Neck Pain    Patient comes in office today with concerns of neck pain and arm pain on the left side since 02/22/17. Patient reports that she has noticed numbness in her thumb and index finger on her left hand, patient admits to doing heavy lifting at work but is unsure if that is what triggered pain.   Has prior evaluation in 2016 for a C-4 cervical radiculopathy per MRI/orthopedic evaluation. Sent to physical therapy but could afford only one session. Reports tingling/numbness at base of neck and in left first and second fingers. HPI   Review of Systems    Objective:   Physical Exam  Constitutional: She appears well-developed and well-nourished. No distress.  Musculoskeletal:  Grip strength 5/5. Cervical ROM  Limited in all planes due to discomfort. Flexing left shoulder exacerbates finger paresthesias.       Assessment:    1. Cervical disc disorder at C5-C6 level with radiculopathy - predniSONE (DELTASONE) 20 MG tablet; Taper as follows: 3 pills for 4 days, two pills for 4 days, one pill for four days  Dispense: 24 tablet; Refill: 0 - HYDROcodone-acetaminophen (NORCO/VICODIN) 5-325 MG tablet; One every 4-6 hours as needed for pain  Dispense: 20 tablet; Refill: 0 - cyclobenzaprine (FLEXERIL) 5 MG tablet; Take 1 tablet (5 mg total) by mouth at bedtime.  Dispense: 14 tablet; Refill: 0  2. Need for influenza vaccination   Quadrivalent flu vaccine Plan:    Minimize lifting at work. Referral if not improving.

## 2017-02-27 ENCOUNTER — Encounter: Payer: Self-pay | Admitting: Family Medicine

## 2017-03-02 ENCOUNTER — Other Ambulatory Visit: Payer: Self-pay | Admitting: Family Medicine

## 2017-03-02 DIAGNOSIS — M50122 Cervical disc disorder at C5-C6 level with radiculopathy: Secondary | ICD-10-CM

## 2017-03-04 ENCOUNTER — Other Ambulatory Visit: Payer: Self-pay | Admitting: Family Medicine

## 2017-03-04 DIAGNOSIS — M50122 Cervical disc disorder at C5-C6 level with radiculopathy: Secondary | ICD-10-CM

## 2017-03-04 MED ORDER — HYDROCODONE-ACETAMINOPHEN 5-325 MG PO TABS: ORAL_TABLET | ORAL | 0 refills | Status: DC

## 2017-03-14 DIAGNOSIS — G540 Brachial plexus disorders: Secondary | ICD-10-CM | POA: Diagnosis not present

## 2017-03-14 DIAGNOSIS — G5602 Carpal tunnel syndrome, left upper limb: Secondary | ICD-10-CM | POA: Diagnosis not present

## 2017-04-01 ENCOUNTER — Encounter: Payer: Self-pay | Admitting: Sports Medicine

## 2017-04-01 ENCOUNTER — Ambulatory Visit (INDEPENDENT_AMBULATORY_CARE_PROVIDER_SITE_OTHER): Payer: 59

## 2017-04-01 ENCOUNTER — Ambulatory Visit (INDEPENDENT_AMBULATORY_CARE_PROVIDER_SITE_OTHER): Payer: 59 | Admitting: Sports Medicine

## 2017-04-01 VITALS — BP 116/80 | HR 93 | Ht 70.0 in | Wt 184.4 lb

## 2017-04-01 DIAGNOSIS — M542 Cervicalgia: Secondary | ICD-10-CM

## 2017-04-01 DIAGNOSIS — M50122 Cervical disc disorder at C5-C6 level with radiculopathy: Secondary | ICD-10-CM

## 2017-04-01 DIAGNOSIS — F419 Anxiety disorder, unspecified: Secondary | ICD-10-CM | POA: Diagnosis not present

## 2017-04-01 DIAGNOSIS — M4802 Spinal stenosis, cervical region: Secondary | ICD-10-CM | POA: Diagnosis not present

## 2017-04-01 DIAGNOSIS — F17200 Nicotine dependence, unspecified, uncomplicated: Secondary | ICD-10-CM

## 2017-04-01 MED ORDER — GABAPENTIN 300 MG PO CAPS: ORAL_CAPSULE | ORAL | 1 refills | Status: DC

## 2017-04-01 MED ORDER — METHYLPREDNISOLONE 4 MG PO TBPK: ORAL_TABLET | ORAL | 0 refills | Status: DC

## 2017-04-01 NOTE — Progress Notes (Signed)
Holly FellsMichael D. Delorise Shinerigby, DO  [facility] [phone]  Holly Austin - 51 y.o. female MRN [id number]  Date of birth: 05-Apr-1966  Visit Date: 04/01/2017  PCP: Anola Gurneyhauvin, Robert, PA   Referred by: Anola Gurneyhauvin, Robert, PA   Scribe for today's visit: Stevenson ClinchBrandy Coleman, CMA    SUBJECTIVE:  Holly Austin Lucchesi is here for New Patient (Initial Visit) (neck and LT arm pain and numbness)  Her neck and LT arm pain symptoms INITIALLY: Began 02/22/2017 and there is no known MOI. She has MRI c-spine 09/02/16.  Described as moderate-severe aching, stabbing, throbbing, radiating to the LT arm and thoracic spine.  Worsened with raising the LT arm. The numbness seems to increase when she tried to raise her arm overhead. She does wake from sleep at times d/t the numbness and throbbing in her arm and fingers.  Improved with rest Additional associated symptoms include: She has noticed numbness at the base of her neck and into the 1st and 2nd fingers of the LT hand. She has noticed increased tightness in the mid back.     At this time symptoms are worsening compared to onset She has been prescribed Cyclobenzaprine, Hydrocodone-APAP, and Prednisone taper by Anola Gurneyobert Chauvin. She did get some relief with these medications but has run out.  She has been wearing wrist brace with minimal relief.    ROS Reports night time disturbances. Denies fevers, chills, or night sweats. Denies unexplained weight loss. Denies personal history of cancer. Denies changes in bowel or bladder habits. Denies recent unreported falls. Reports new or worsening dyspnea or wheezing. Denies headaches or dizziness.  Reports numbness, tingling or weakness  In the extremities.  Denies dizziness or presyncopal episodes Denies lower extremity edema     HISTORY & PERTINENT PRIOR DATA:  Prior History reviewed and updated per electronic medical record.  Significant history, findings, studies and  interim changes include:  reports that she has been smoking cigarettes.  She has a 25.00 pack-year smoking history. she has never used smokeless tobacco. No results for input(s): HGBA1C, LABURIC, CREATINE in the last 8760 hours. No specialty comments available. No problems updated.   OBJECTIVE:  VS:  HT:5\' 10"  (177.8 cm)   WT:184 lb 6.4 oz (83.6 kg)  BMI:26.46    BP:116/80  HR:93bpm  TEMP: ( )  RESP:98 %  PHYSICAL EXAM: Constitutional: WDWN, Non-toxic appearing. Psychiatric: Alert & appropriately interactive. Not depressed or anxious appearing. Respiratory: No increased work of breathing. Trachea Midline Eyes: Pupils are equal. EOM intact without nystagmus. No scleral icterus Cardiovascular:  Peripheral Pulses: peripheral pulses symmetrical No clubbing or cyanosis appreciated Capillary Refill is normal, less than 2 seconds No signficant generalized edema/anasarca  SHOULDER Findings: No stiffness, no weakness, no crepitus noted  Neck:   Well aligned, no significant torticollis  Midline Bony TTP: none   Paraspinal Muscle Spasm: Yes, bilateral  CERVICAL ROM: Limited flexion, extension, side bending and rotation in all planes symmetrically  NEURAL TENSION SIGNS Right Left  Brachial Plexus Squeeze: positive, moderate pain positive, moderate pain  Arm Squeeze Test: positive, mild pain positive, severe pain  Spurling's Compression Test: positive, moderate pain positive, moderate pain  Lhermitte's Compression test: Negative, no radiating pain   REFLEXES Right Left  DTR - C5 -Biceps  2+ absent  DTR - C6 - Brachiorad 2+ absent  DTR - C7 - Triceps 2+ 2+   UMN - Hoffman's negative negative  UMN - Pectoral Negative/Normal Negative/Normal   MOTOR TESTING:  Generalized upper extremity strength is intact.  She has    No additional findings.   ASSESSMENT & PLAN:   1. Cervical pain (neck)   2. Cervical disc disorder at C5-C6 level with radiculopathy   3. CIGARETTE SMOKER     4. Anxiety disorder, unspecified type    PLAN: Given the presentation and overall symptoms likely recurrent radiculitis.  Medications as below.  Reevaluate in 2 weeks and red flags discussed.  May benefit with Neurontin for her underlying anxiety.  Can consider osteopathic manipulation versus formal therapeutic exercises versus referral to physical therapy at follow-up  ++++++++++++++++++++++++++++++++++++++++++++ Orders & Meds: Orders Placed This Encounter  Procedures  . DG Cervical Spine 2 or 3 views    Meds ordered this encounter  Medications  . gabapentin (NEURONTIN) 300 MG capsule    Sig: Start with 1 tab po qhs X 1 week, then increase to 1 tab po bid X 1 week then 1 tab po tid prn    Dispense:  90 capsule    Refill:  1  . methylPREDNISolone (MEDROL DOSEPAK) 4 MG TBPK tablet    Sig: Take by mouth as directed. Take 6 tablets on the first day prescribed then as directed.    Dispense:  21 tablet    Refill:  0    ++++++++++++++++++++++++++++++++++++++++++++ Follow-up: Return in about 2 weeks (around 04/15/2017).   Pertinent documentation may be included in additional procedure notes, imaging studies, problem based documentation and patient instructions. Please see these sections of the encounter for additional information regarding this visit. CMA/ATC served as Neurosurgeonscribe during this visit. History, Physical, and Plan performed by medical provider. Documentation and orders reviewed and attested to.      Andrena MewsMichael D Meri Pelot, DO    [facility]

## 2017-04-08 ENCOUNTER — Encounter: Payer: Self-pay | Admitting: Sports Medicine

## 2017-04-16 ENCOUNTER — Encounter: Payer: Self-pay | Admitting: Sports Medicine

## 2017-04-16 ENCOUNTER — Ambulatory Visit (INDEPENDENT_AMBULATORY_CARE_PROVIDER_SITE_OTHER): Payer: 59 | Admitting: Sports Medicine

## 2017-04-16 VITALS — BP 112/80 | HR 90 | Ht 70.0 in | Wt 182.8 lb

## 2017-04-16 DIAGNOSIS — M25512 Pain in left shoulder: Secondary | ICD-10-CM | POA: Diagnosis not present

## 2017-04-16 DIAGNOSIS — M50122 Cervical disc disorder at C5-C6 level with radiculopathy: Secondary | ICD-10-CM

## 2017-04-16 DIAGNOSIS — M542 Cervicalgia: Secondary | ICD-10-CM | POA: Diagnosis not present

## 2017-04-16 DIAGNOSIS — F17200 Nicotine dependence, unspecified, uncomplicated: Secondary | ICD-10-CM | POA: Diagnosis not present

## 2017-04-16 MED ORDER — CYCLOBENZAPRINE HCL 10 MG PO TABS: 10.0000 mg | ORAL_TABLET | Freq: Three times a day (TID) | ORAL | 1 refills | Status: DC | PRN

## 2017-04-16 NOTE — Patient Instructions (Signed)
We are going to refer you to [facility] to have them work on your shoulder and neck..  Please call to schedule an appointment  [facility] Address: [address], [place], [place] [address] Phone: [phone]  Continue with the gabapentin and start cyclobenzaprine.  [facility]

## 2017-04-16 NOTE — Progress Notes (Signed)
Holly FellsMichael D. Holly Shinerigby, DO  [facility] [phone]  Holly GardenerStephanie L Austin - 52 y.o. female MRN [id number]  Date of birth: 1965-09-26  Visit Date: 04/16/2017  PCP: Anola Gurneyhauvin, Robert, PA   Referred by: Anola Gurneyhauvin, Robert, PA   Scribe for today's visit: Holly FabianMolly Austin, LAT, ATC     SUBJECTIVE:  Holly GardenerStephanie L Austin is here for Follow-up (Neck pain) .   Notes form visit on 04/01/17:  Her neck and LT arm pain symptoms INITIALLY: Began 02/22/2017 and there is no known MOI. She has MRI c-spine 09/03/14.  Described as moderate-severe aching, stabbing, throbbing, radiating to the LT arm and thoracic spine.  Worsened with raising the LT arm. The numbness seems to increase when she tried to raise her arm overhead. She does wake from sleep at times d/t the numbness and throbbing in her arm and fingers.  Improved with rest Additional associated symptoms include: She has noticed numbness at the base of her neck and into the 1st and 2nd fingers of the LT hand. She has noticed increased tightness in the mid back.     At this time symptoms are worsening compared to onset She has been prescribed Cyclobenzaprine, Hydrocodone-APAP, and Prednisone taper by Anola Gurneyobert Austin. She did get some relief with these medications but has run out.  She has been wearing wrist brace with minimal relief.    Compared to the last office visit on 04/01/17, her previously described neck and L arm symptoms are worsening, stating that she feels like she has pokers poking her in her L scapula and the actual L shoulder joint is painful. Current symptoms are 8/10 at rest and worsen w/ attempts at L shoulder movement & are radiating to L arm w/ N/T noted in L thumb and index finger. She has been taking Gabapentin and finished her Medrol dose pack.   ROS Reports night time disturbances. Denies fevers, chills, or night sweats. Denies unexplained weight loss. Denies personal history of  cancer. Denies changes in bowel or bladder habits. Denies recent unreported falls. Denies new or worsening dyspnea or wheezing. Reports headaches or dizziness. Yes to Has. Reports numbness, tingling or weakness  In the extremities. L index finger and thumb. Denies dizziness or presyncopal episodes Denies lower extremity edema     HISTORY & PERTINENT PRIOR DATA:  Prior History reviewed and updated per electronic medical record.  Significant history, findings, studies and interim changes include:  reports that she has been smoking cigarettes.  She has a 25.00 pack-year smoking history. she has never used smokeless tobacco. No results for input(s): HGBA1C, LABURIC, CREATINE in the last 8760 hours. No specialty comments available. No problems updated.  OBJECTIVE:  VS:  HT:5\' 10"  (177.8 cm)   WT:182 lb 12.8 oz (82.9 kg)  BMI:26.23    BP:112/80  HR:90bpm  TEMP: ( )  RESP:96 %   PHYSICAL EXAM: Constitutional: WDWN, Non-toxic appearing. Psychiatric: Alert & appropriately interactive. Not depressed or anxious appearing. Respiratory: No increased work of breathing. Trachea Midline Eyes: Pupils are equal. EOM intact without nystagmus. No scleral icterus Cardiovascular:  Peripheral Pulses: peripheral pulses symmetrical No clubbing or cyanosis appreciated Capillary Refill is normal, less than 2 seconds No signficant generalized edema/anasarca Sensory Exam: intact to light touch   SHOULDER Findings: moderate stiffness, generalized weakness, no crepitus noted.  Internal rotation external rotation strength intact.  Negative Hawkins and negative Neer's guarded passive motion due to periscapular pain.  Neck:   Well aligned, no significant  torticollis  Midline Bony TTP: none   Paraspinal Muscle Spasm: Yes, bilateral  CERVICAL ROM: Limited side bending and rotation.  NEURAL TENSION SIGNS Right Left  Brachial Plexus Squeeze: positive, moderate pain positive, mild pain  Arm Squeeze  Test: positive, severe pain positive, severe pain  Spurling's Compression Test: normal, no pain positive, mild pain  Lhermitte's Compression test: Negative, no radiating pain   REFLEXES Right Left  DTR - C5 -Biceps  1+ 2+  DTR - C6 - Brachiorad 1+ 1+  DTR - C7 - Triceps 1+ 1+   UMN - Hoffman's Negative/Normal Negative/Normal  UMN - Pectoral     MOTOR TESTING: Intact in all UE myotomes  No additional findings.   ASSESSMENT & PLAN:   1. Cervical pain (neck)   2. Cervical disc disorder at C5-C6 level with radiculopathy   3. CIGARETTE SMOKER   4. Pain in joint of left shoulder    PLAN: Symptoms are similar but more severe to what they were several years ago when she was having similar left-sided hand and periscapular pain.  She was feeling well while on steroids but since stopping and has had a significant return in her symptoms.  I would like for her to undergo a directed C6-7 transforaminal epidural on the left for both diagnostic and therapeutic purposes.  I would also like for her to begin with physical therapy formally to have them work with modalities and passive treatments and once epidural has been performed increasing her active exercises as tolerated.  Change in muscle relaxant continue with gabapentin at this time.  ++++++++++++++++++++++++++++++++++++++++++++ Orders & Meds: Orders Placed This Encounter  Procedures  . DG INJECT DIAG/THERA/INC NEEDLE/CATH/PLC EPI/CERV/THOR W/IMG  . Ambulatory referral to Physical Therapy  . Ambulatory referral to Interventional Radiology    Meds ordered this encounter  Medications  . cyclobenzaprine (FLEXERIL) 10 MG tablet    Sig: Take 1 tablet (10 mg total) by mouth 3 (three) times daily as needed for muscle spasms.    Dispense:  30 tablet    Refill:  1    ++++++++++++++++++++++++++++++++++++++++++++ Follow-up: Return in about 6 weeks (around 05/28/2017).   Pertinent documentation may be included in additional procedure notes, imaging  studies, problem based documentation and patient instructions. Please see these sections of the encounter for additional information regarding this visit. CMA/ATC served as Neurosurgeon during this visit. History, Physical, and Plan performed by medical provider. Documentation and orders reviewed and attested to.      [name], DO    [facility] Physician

## 2017-04-22 ENCOUNTER — Other Ambulatory Visit: Payer: Self-pay

## 2017-04-26 ENCOUNTER — Ambulatory Visit
Admission: RE | Admit: 2017-04-26 | Discharge: 2017-04-26 | Disposition: A | Discharge status: Home / Self Care | Payer: 59 | Source: Ambulatory Visit | Attending: Sports Medicine | Admitting: Sports Medicine

## 2017-04-26 DIAGNOSIS — M50222 Other cervical disc displacement at C5-C6 level: Secondary | ICD-10-CM | POA: Diagnosis not present

## 2017-04-26 DIAGNOSIS — M50122 Cervical disc disorder at C5-C6 level with radiculopathy: Secondary | ICD-10-CM

## 2017-04-26 DIAGNOSIS — M50223 Other cervical disc displacement at C6-C7 level: Secondary | ICD-10-CM | POA: Diagnosis not present

## 2017-04-26 MED ORDER — TRIAMCINOLONE ACETONIDE 40 MG/ML IJ SUSP (RADIOLOGY)
60.0000 mg | Freq: Once | INTRAMUSCULAR | Status: AC
  Administered 2017-04-26: 60 mg via EPIDURAL

## 2017-04-26 MED ORDER — IOPAMIDOL (ISOVUE-M 300) INJECTION 61%
1.0000 mL | Freq: Once | INTRAMUSCULAR | Status: AC | PRN
  Administered 2017-04-26: 1 mL via EPIDURAL

## 2017-07-23 ENCOUNTER — Encounter: Payer: Self-pay | Admitting: Family Medicine

## 2017-07-23 ENCOUNTER — Ambulatory Visit: Payer: 59 | Admitting: Family Medicine

## 2017-07-23 VITALS — BP 118/84 | HR 65 | Temp 98.6°F | Resp 16 | Wt 180.4 lb

## 2017-07-23 DIAGNOSIS — R5383 Other fatigue: Secondary | ICD-10-CM | POA: Diagnosis not present

## 2017-07-23 DIAGNOSIS — M25441 Effusion, right hand: Secondary | ICD-10-CM | POA: Diagnosis not present

## 2017-07-23 MED ORDER — PREDNISONE 10 MG PO TABS: ORAL_TABLET | ORAL | 0 refills | Status: DC

## 2017-07-23 NOTE — Patient Instructions (Signed)
We will call you with the lab results. Let me know if the prednisone helps.

## 2017-07-23 NOTE — Progress Notes (Addendum)
Subjective:     Patient ID: Holly GardenerStephanie L Austin, female   DOB: 04/27/1965, 52 y.o.   MRN: [id number] Chief Complaint  Patient presents with  . Joint Pain    Patient comes in office today with concerns of joint pain to the 2nd digit on her right hand. Patient reports that she has had pain for about 3 months or more and has noticed some swelling. Patient describes pain as a dull ache at rest and states that when she is moving it, it becomes sharp. Patient reports decrease strength to hand.   . Fatigue    Patient would like to address increase symptom of fatigue.    HPI Continues to work at [employer] performing a wide range of activities involving her hands. Has been taking two ibuprofen every 6 hours for her sx. Believes her maternal grandmother had R.A. States she is trying to cut down on smoking; no alcohol use.  Review of Systems  Respiratory: Negative for shortness of breath.   Cardiovascular: Negative for chest pain and palpitations.  Psychiatric/Behavioral: Negative for dysphoric mood.       Objective:   Physical Exam  Constitutional: She appears well-developed and well-nourished. No distress.  Cardiovascular: Normal rate and regular rhythm.  Pulmonary/Chest: Breath sounds normal.  Abdominal: Soft. There is no tenderness.  Musculoskeletal: She exhibits no edema (of lower extremities).  Right second MCP with mild tenderness without significant swelling       Assessment:    1. Swelling of hand joint, right - Sedimentation rate - ANA Direct w/Reflex if Positive - CYCLIC CITRUL PEPTIDE ANTIBODY, IGG/IGA - Rheumatoid Factor - predniSONE (DELTASONE) 10 MG tablet; Taper daily as follows: 6 pills, 5, 4, 3, 2, 1  Dispense: 21 tablet; Refill: 0  2. Fatigue, unspecified type - Comprehensive metabolic panel - CBC with Differential/Platelet - T4, free - TSH    Plan:    Further f/u pending lab work. Consider x-ray and rheumatology referral.

## 2017-07-25 ENCOUNTER — Telehealth: Payer: Self-pay

## 2017-07-25 ENCOUNTER — Other Ambulatory Visit: Payer: Self-pay | Admitting: Family Medicine

## 2017-07-25 DIAGNOSIS — M25541 Pain in joints of right hand: Secondary | ICD-10-CM

## 2017-07-25 LAB — COMPREHENSIVE METABOLIC PANEL
ALBUMIN: 4.4 g/dL (ref 3.5–5.5)
ALK PHOS: 49 IU/L (ref 39–117)
ALT: 13 IU/L (ref 0–32)
AST: 17 IU/L (ref 0–40)
Albumin/Globulin Ratio: 2 (ref 1.2–2.2)
BUN / CREAT RATIO: 16 (ref 9–23)
BUN: 12 mg/dL (ref 6–24)
Bilirubin Total: 0.4 mg/dL (ref 0.0–1.2)
CO2: 22 mmol/L (ref 20–29)
CREATININE: 0.75 mg/dL (ref 0.57–1.00)
Calcium: 9.4 mg/dL (ref 8.7–10.2)
Chloride: 105 mmol/L (ref 96–106)
GFR calc Af Amer: 108 mL/min/{1.73_m2} (ref >59)
GFR calc non Af Amer: 94 mL/min/{1.73_m2} (ref >59)
GLUCOSE: 85 mg/dL (ref 65–99)
Globulin, Total: 2.2 g/dL (ref 1.5–4.5)
Potassium: 4.6 mmol/L (ref 3.5–5.2)
Sodium: 142 mmol/L (ref 134–144)
TOTAL PROTEIN: 6.6 g/dL (ref 6.0–8.5)

## 2017-07-25 LAB — ANA W/REFLEX IF POSITIVE
ANA: POSITIVE — AB
ENA RNP Ab: 1.1 AI — ABNORMAL HIGH (ref 0.0–0.9)
ENA SM Ab Ser-aCnc: <0.2 AI (ref 0.0–0.9)
ENA SSA (RO) Ab: <0.2 AI (ref 0.0–0.9)
ENA SSB (LA) Ab: <0.2 AI (ref 0.0–0.9)
Scleroderma SCL-70: <0.2 AI (ref 0.0–0.9)
dsDNA Ab: 1 IU/mL (ref 0–9)

## 2017-07-25 LAB — T4, FREE: Free T4: 0.82 ng/dL (ref 0.82–1.77)

## 2017-07-25 LAB — CBC WITH DIFFERENTIAL/PLATELET
Basophils Absolute: 0.1 10*3/uL (ref 0.0–0.2)
Basos: 1 %
EOS (ABSOLUTE): 0.2 10*3/uL (ref 0.0–0.4)
EOS: 2 %
HEMOGLOBIN: 14.5 g/dL (ref 11.1–15.9)
Hematocrit: 42.1 % (ref 34.0–46.6)
IMMATURE GRANS (ABS): 0 10*3/uL (ref 0.0–0.1)
IMMATURE GRANULOCYTES: 0 %
Lymphocytes Absolute: 3.2 10*3/uL — ABNORMAL HIGH (ref 0.7–3.1)
Lymphs: 38 %
MCH: 32.3 pg (ref 26.6–33.0)
MCHC: 34.4 g/dL (ref 31.5–35.7)
MCV: 94 fL (ref 79–97)
MONOCYTES: 10 %
Monocytes Absolute: 0.8 10*3/uL (ref 0.1–0.9)
Neutrophils Absolute: 4.1 10*3/uL (ref 1.4–7.0)
Neutrophils: 49 %
Platelets: 326 10*3/uL (ref 150–379)
RBC: 4.49 x10E6/uL (ref 3.77–5.28)
RDW: 13.3 % (ref 12.3–15.4)
WBC: 8.3 10*3/uL (ref 3.4–10.8)

## 2017-07-25 LAB — SEDIMENTATION RATE: SED RATE: 7 mm/h (ref 0–32)

## 2017-07-25 LAB — CYCLIC CITRUL PEPTIDE ANTIBODY, IGG/IGA: Cyclic Citrullin Peptide Ab: 4 units (ref 0–19)

## 2017-07-25 LAB — RHEUMATOID FACTOR: RHEUMATOID FACTOR: 19.7 [IU]/mL — AB (ref 0.0–13.9)

## 2017-07-25 LAB — TSH: TSH: 3.39 u[IU]/mL (ref 0.450–4.500)

## 2017-07-25 NOTE — Telephone Encounter (Signed)
-----   Message from Anola Gurneyobert Chauvin, [place] sent at 07/25/2017  7:42 AM EDT ----- Labs look pretty good -probably not an inflammatory arthritis (a couple of your non-specific markers are elevated) have ordered an x-ray of that finger to further help with the diagnosis at outpatient x-ray across the street.

## 2017-07-25 NOTE — Telephone Encounter (Signed)
Patient advised.KW 

## 2017-07-29 ENCOUNTER — Ambulatory Visit
Admission: RE | Admit: 2017-07-29 | Discharge: 2017-07-29 | Disposition: A | Discharge status: Home / Self Care | Payer: 59 | Source: Ambulatory Visit | Attending: Family Medicine | Admitting: Family Medicine

## 2017-07-29 ENCOUNTER — Other Ambulatory Visit: Payer: Self-pay | Admitting: Family Medicine

## 2017-07-29 DIAGNOSIS — M25541 Pain in joints of right hand: Secondary | ICD-10-CM | POA: Insufficient documentation

## 2017-07-29 DIAGNOSIS — M79644 Pain in right finger(s): Secondary | ICD-10-CM | POA: Diagnosis not present

## 2017-07-30 ENCOUNTER — Telehealth: Payer: Self-pay

## 2017-08-01 ENCOUNTER — Other Ambulatory Visit: Payer: Self-pay | Admitting: Family Medicine

## 2017-08-01 ENCOUNTER — Ambulatory Visit: Payer: 59 | Admitting: Family Medicine

## 2017-08-01 ENCOUNTER — Encounter: Payer: Self-pay | Admitting: Family Medicine

## 2017-08-01 VITALS — BP 104/80 | HR 101 | Temp 98.4°F | Resp 16 | Wt 180.2 lb

## 2017-08-01 DIAGNOSIS — S39012A Strain of muscle, fascia and tendon of lower back, initial encounter: Secondary | ICD-10-CM | POA: Diagnosis not present

## 2017-08-01 MED ORDER — HYDROCODONE-ACETAMINOPHEN 5-325 MG PO TABS: 1.0000 | ORAL_TABLET | Freq: Four times a day (QID) | ORAL | 0 refills | Status: AC | PRN

## 2017-08-01 MED ORDER — CYCLOBENZAPRINE HCL 5 MG PO TABS: 5.0000 mg | ORAL_TABLET | Freq: Three times a day (TID) | ORAL | 0 refills | Status: DC | PRN

## 2017-08-01 NOTE — Progress Notes (Signed)
Subjective:     Patient ID: Holly GardenerStephanie L Austin, female   DOB: December 27, 1965, 52 y.o.   MRN: [id number] Chief Complaint  Patient presents with  . Back Pain    Patient comes in office today with complaints of lower back pain that began on 07/27/17. Patient reports that she was at work when pain began, she states that she was bending over file cabinet and when she stood up she had a tightness feeling in her back. Patient states as the days progressd she had stiffness and sharp spasms that were intermittent. Patient has been taking otc Ibuprofen.    HPI Denies radicular sx. States she has continued to work but has avoided lifting. She is pending rheumatology referral regarding her finger.  Review of Systems     Objective:   Physical Exam  Constitutional: She appears well-developed and well-nourished. She appears distressed (moderate pain when changing positions).  Musculoskeletal:  Muscle strength in lower extremities 5/5. SLR's to 80- 90 degrees without radiation of back pain. Mildly tender in her bilateral lumbar para- vertebral area       Assessment:    1. Low back strain, initial encounter - cyclobenzaprine (FLEXERIL) 5 MG tablet; Take 1 tablet (5 mg total) by mouth 3 (three) times daily as needed for muscle spasms.  Dispense: 21 tablet; Refill: 0 - HYDROcodone-acetaminophen (NORCO/VICODIN) 5-325 MG tablet; Take 1 tablet by mouth every 6 (six) hours as needed for up to 5 days for moderate pain. One every 4-6 hours as needed for pain  Dispense: 20 tablet; Refill: 0    Plan:    Continue ibuprofen and application of heat.  No lifting or squatting.

## 2017-08-01 NOTE — Patient Instructions (Signed)
Continue warm compresses and ibuprofen. No lifting or squatting if possible.

## 2017-08-27 ENCOUNTER — Telehealth: Payer: Self-pay | Admitting: Family Medicine

## 2017-08-27 NOTE — Telephone Encounter (Signed)
Made attempt to contact Emergency contact information per DPR Irving Shows (spouse). Was unable to communicate with him either at home or cell. I am not sure if they are receiving any calls since number is still ringing and cell numbers aren't being picked up.

## 2017-08-27 NOTE — Telephone Encounter (Signed)
FYI--[facility] rheumatology department  have made attempts to contact pt without return call.I have also left message for pt.Pt was referred for finger pain

## 2017-08-27 NOTE — Telephone Encounter (Signed)
LMOM for pt to call back. Will contact Emergency contact number to reach patient.

## 2017-08-27 NOTE — Telephone Encounter (Signed)
No further attempts are necessary.

## 2017-09-11 ENCOUNTER — Encounter: Payer: 59 | Admitting: Family Medicine

## 2018-01-13 NOTE — Telephone Encounter (Signed)
Entered in error.KW 

## 2018-03-12 ENCOUNTER — Encounter: Payer: Self-pay | Admitting: Family Medicine

## 2018-03-12 ENCOUNTER — Ambulatory Visit: Payer: 59 | Admitting: Family Medicine

## 2018-03-12 VITALS — BP 100/60 | HR 68 | Temp 97.7°F | Resp 16 | Wt 179.8 lb

## 2018-03-12 DIAGNOSIS — M79641 Pain in right hand: Secondary | ICD-10-CM

## 2018-03-12 DIAGNOSIS — F419 Anxiety disorder, unspecified: Secondary | ICD-10-CM

## 2018-03-12 MED ORDER — HYDROXYZINE HCL 25 MG PO TABS: 25.0000 mg | ORAL_TABLET | Freq: Four times a day (QID) | ORAL | 0 refills | Status: AC | PRN

## 2018-03-12 NOTE — Patient Instructions (Signed)
We will call about the orthopedic referral.

## 2018-03-12 NOTE — Progress Notes (Signed)
  Subjective:     Patient ID: Holly Austin, female   DOB: [birth year], 52 y.o.   MRN: [id number] Chief Complaint  Patient presents with  . Hand Pain    Patient comes in office today with concerns of a lump/knot? under her skin on her right hand. Paitent states that it is located just below her ring finger and has been present a month, patient states that in the past two weeks area has been sore to touch.    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 In the last two weeks it has become painful. Denies trigger finger. Hx of left Dupuytren's noted on problem list from 2012.  Review of Systems     Objective:   Physical Exam  Constitutional: She appears well-developed and well-nourished. No distress.  Musculoskeletal:  Right palm with tender induration @ base of her 4th finger. Able to flex and extend without difficulty.       Assessment:    1. Hand pain, right: Suspect early trigger finger. - Ambulatory referral to Orthopedic Surgery  2. Anxiety disorder, unspecified type - hydrOXYzine (ATARAX/VISTARIL) 25 MG tablet; Take 1 tablet (25 mg total) by mouth every 6 (six) hours as needed for anxiety (anxiety/agitation or CIWA < or = 10).  Dispense: 120 tablet; Refill: 0    Plan:    Further f/u as needed.

## 2018-05-02 DIAGNOSIS — J019 Acute sinusitis, unspecified: Secondary | ICD-10-CM | POA: Insufficient documentation

## 2018-05-09 ENCOUNTER — Encounter: Payer: Self-pay | Admitting: Family Medicine

## 2018-08-19 IMAGING — CR DG CHEST 2V
1 series · 2 of 2 positions shown · non-contrast
Comparison: 04/17/2009

CLINICAL DATA: Shortness of breath

EXAM:
CHEST  2 VIEW

[Series 1: dg chest 2 view · 0.14mm/px · 2 of 2 slices shown]
[im 1/2]
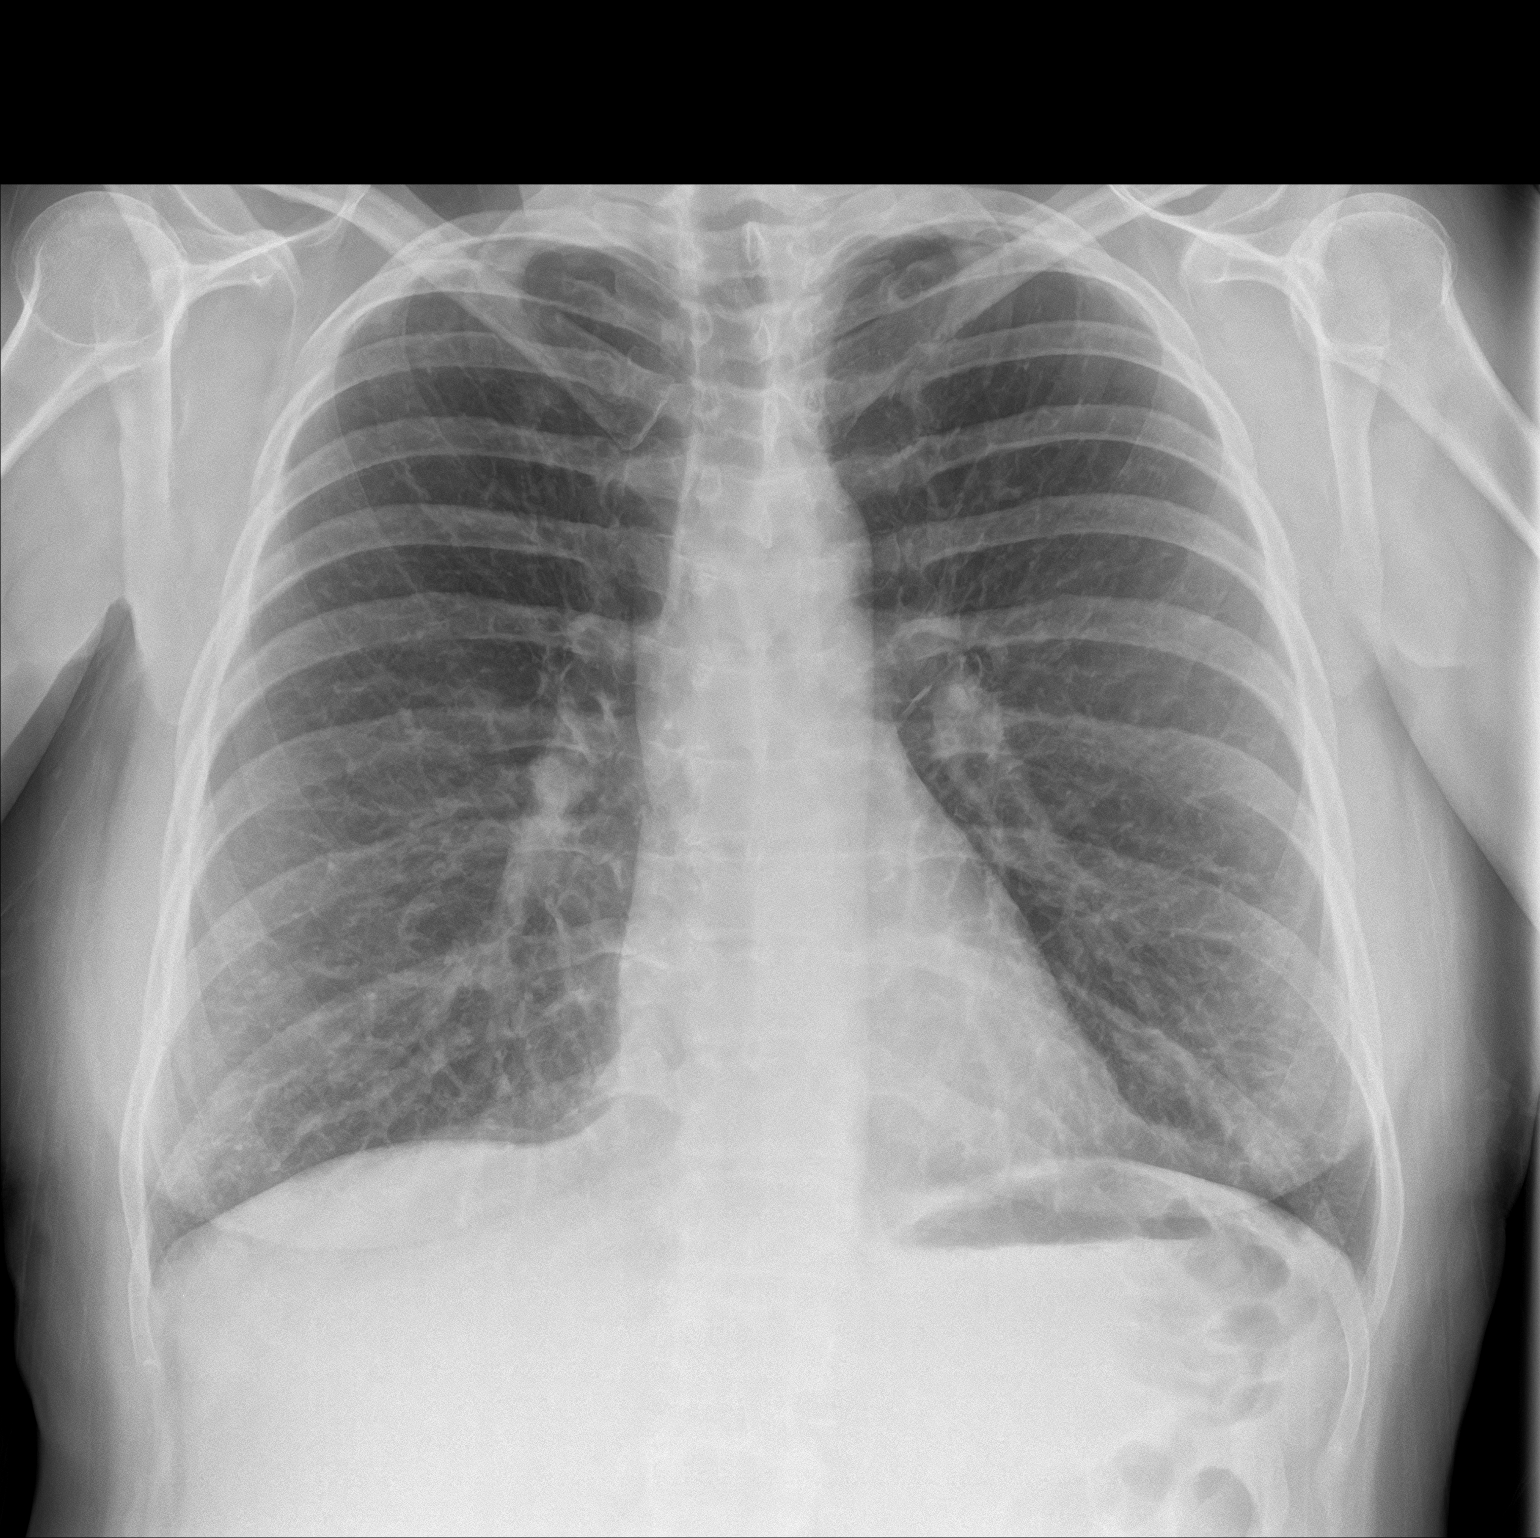
[im 2/2]
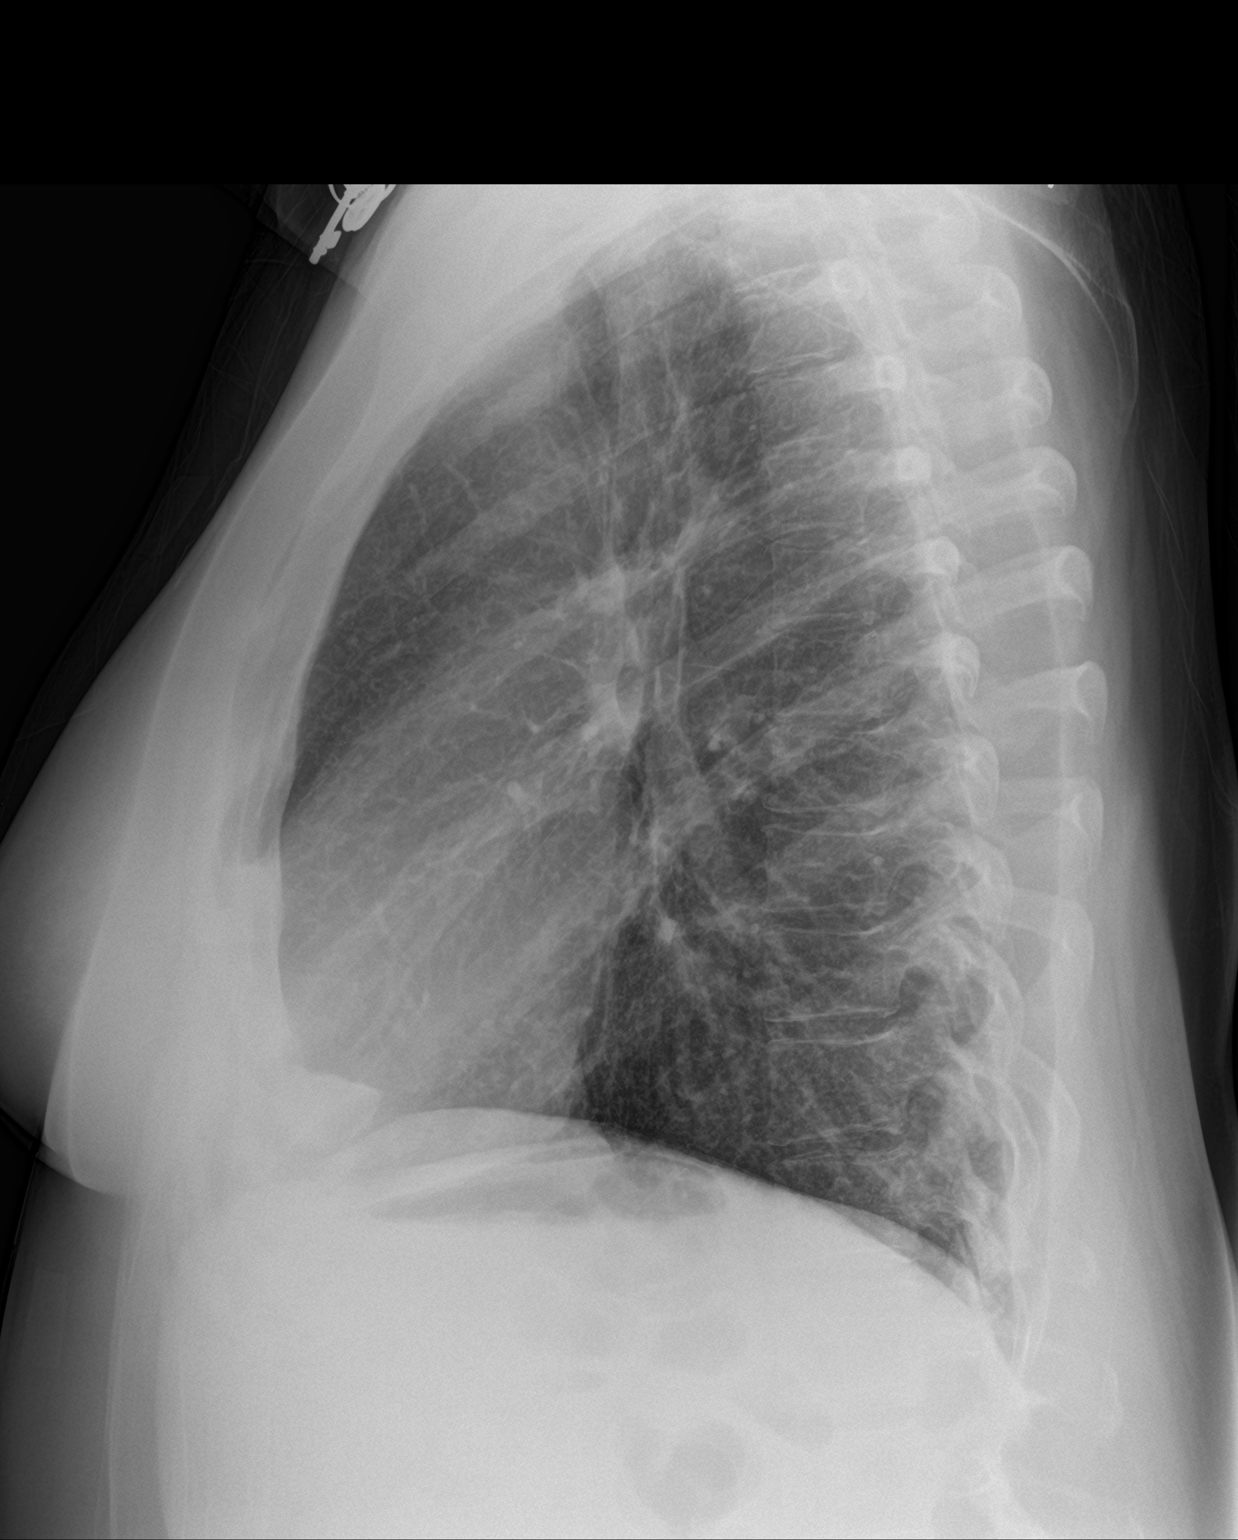

[2 of 2 positions shown; findings below may reference images not displayed]

FINDINGS: Hyperinflation. Prominent interstitial opacity at the lung bases. No
focal consolidation or effusion. Normal heart size. No pneumothorax.
IMPRESSION: 1. No focal infiltrate
2. Hyperinflation. Prominent interstitial opacities at the lung
bases may be due to bronchial inflammation

## 2019-04-15 ENCOUNTER — Ambulatory Visit (INDEPENDENT_AMBULATORY_CARE_PROVIDER_SITE_OTHER): Payer: 59 | Admitting: Physician Assistant

## 2019-04-15 ENCOUNTER — Encounter: Payer: Self-pay | Admitting: Physician Assistant

## 2019-04-15 ENCOUNTER — Other Ambulatory Visit: Payer: Self-pay

## 2019-04-15 VITALS — BP 130/85 | HR 78 | Temp 94.8°F | Wt 183.2 lb

## 2019-04-15 DIAGNOSIS — M7542 Impingement syndrome of left shoulder: Secondary | ICD-10-CM

## 2019-04-15 MED ORDER — PREDNISONE 10 MG PO TABS: ORAL_TABLET | ORAL | 0 refills | Status: DC

## 2019-04-15 NOTE — Progress Notes (Signed)
Patient: Holly Austin Female    DOB: 10-09-1965   54 y.o.   MRN: [id number] Visit Date: 04/15/2019  Today's Provider: Trey Sailors, PA-C   Chief Complaint  Patient presents with  . Shoulder Pain   Subjective:    I, [name],CMA am acting as a Neurosurgeon for [name].   Shoulder Pain  The pain is present in the left shoulder. This is a new problem. The current episode started 1 to 4 weeks ago. The problem occurs constantly. The problem has been gradually worsening. The quality of the pain is described as sharp and aching. The pain is at a severity of 9/10. The pain is severe. Associated symptoms include a limited range of motion and stiffness. Pertinent negatives include no inability to bear weight, joint locking, joint swelling, numbness or tingling. The symptoms are aggravated by activity and lying down. She has tried cold, heat, acetaminophen, NSAIDS, OTC ointments and OTC pain meds for the symptoms. The treatment provided no relief.   Has history of cervical radiculopathy but this is different. Difficulty reaching behind her and putting on jackets, reaching above her. 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 Dominant is right hand. Reports in the beginning treatments like ice, heat were working minimally. Leftover gabapentin from neck pain - patient used this without relief. Doesn't remember the dosage but took one pill at bedtime for the past week.   Allergies  Allergen Reactions  . Penicillin G Hives and Shortness Of Breath    .Marland KitchenHas patient had a PCN reaction causing immediate rash, facial/tongue/throat swelling, SOB or lightheadedness with hypotension: Yes Has patient had a PCN reaction causing severe rash involving mucus membranes or skin necrosis: No Has patient had a PCN reaction that required hospitalization No Has patient had a PCN reaction occurring within the last 10 years: No If all of the above answers are "NO", then  may proceed with Cephalosporin use.    . Latex Other (See Comments)    Blisters     Current Outpatient Medications:  .  albuterol (PROVENTIL HFA;VENTOLIN HFA) 108 (90 Base) MCG/ACT inhaler, Inhale 2 puffs into the lungs every 6 (six) hours as needed for wheezing or shortness of breath., Disp: 1 Inhaler, Rfl: 2 .  hydrOXYzine (ATARAX/VISTARIL) 25 MG tablet, Take 1 tablet (25 mg total) by mouth every 6 (six) hours as needed for anxiety (anxiety/agitation or CIWA < or = 10)., Disp: 120 tablet, Rfl: 0  Review of Systems  Musculoskeletal: Positive for stiffness.  Neurological: Negative for tingling and numbness.    Social History   Tobacco Use  . Smoking status: Current Every Day Smoker    Packs/day: 1.00    Years: 25.00    Pack years: 25.00    Types: Cigarettes  . Smokeless tobacco: Never Used  Substance Use Topics  . Alcohol use: Yes    Comment: few days a week       Objective:   BP 130/85 (BP Location: Right Arm, Patient Position: Sitting, Cuff Size: Normal)   Pulse 78   Temp (!) 94.8 F (34.9 C) (Temporal)   Wt 183 lb 3.2 oz (83.1 kg)   BMI 26.29 kg/m  Vitals:   04/15/19 1605  BP: 130/85  Pulse: 78  Temp: (!) 94.8 F (34.9 C)  TempSrc: Temporal  Weight: 183 lb 3.2 oz (83.1 kg)  Body mass index is 26.29 kg/m.   Physical Exam Constitutional:  Appearance: Normal appearance.  Musculoskeletal:     Right shoulder: Normal.     Left shoulder: Decreased range of motion.     Comments: Pain with abduction past 90 degrees in left shoulder. Positive belly press and lift off on left side.   Neurological:     Mental Status: She is alert.      No results found for any visits on 04/15/19.     Assessment & Plan    1. Impingement syndrome of left shoulder  - predniSONE (DELTASONE) 10 MG tablet; Take 6 pills on day 1, 5 pills on day 2 and so on until complete.  Dispense: 21 tablet; Refill: 0   The entirety of the information documented in the History of  Present Illness, Review of Systems and Physical Exam were personally obtained by me. Portions of this information were initially documented by [facility] and reviewed by me for thoroughness and accuracy.   F/u PRN    [name], PA-C  [facility]

## 2019-04-15 NOTE — Patient Instructions (Signed)

## 2019-04-17 ENCOUNTER — Telehealth (INDEPENDENT_AMBULATORY_CARE_PROVIDER_SITE_OTHER): Payer: 59 | Admitting: Family Medicine

## 2019-04-17 DIAGNOSIS — Z5329 Procedure and treatment not carried out because of patient's decision for other reasons: Secondary | ICD-10-CM

## 2019-04-17 DIAGNOSIS — Z91199 Patient's noncompliance with other medical treatment and regimen due to unspecified reason: Secondary | ICD-10-CM

## 2019-04-17 NOTE — Progress Notes (Signed)
Patient no showed for appointment.  She was unavailable on the MyChart platform at the time of her visit and she was unable to be reached by phone before or after her visit.  Voicemails were left and patient was instructed to call back and reschedule her appointment if she still wishes to be seen.

## 2019-05-18 ENCOUNTER — Ambulatory Visit: Payer: 59 | Attending: Internal Medicine

## 2019-05-18 DIAGNOSIS — Z20822 Contact with and (suspected) exposure to covid-19: Secondary | ICD-10-CM

## 2019-05-19 LAB — NOVEL CORONAVIRUS, NAA: SARS-CoV-2, NAA: NOT DETECTED

## 2019-06-26 ENCOUNTER — Ambulatory Visit: Payer: 59 | Attending: Internal Medicine

## 2019-06-26 DIAGNOSIS — Z23 Encounter for immunization: Secondary | ICD-10-CM

## 2019-06-26 NOTE — Progress Notes (Signed)
   Covid-19 Vaccination Clinic  Name:  Holly Austin    MRN: [id number] DOB: 04-22-1965  06/26/2019  [name] was observed post Covid-19 immunization for 15 minutes without incident. She was provided with Vaccine Information Sheet and instruction to access the V-Safe system.   [name] was instructed to call 911 with any severe reactions post vaccine: Marland Kitchen Difficulty breathing  . Swelling of face and throat  . A fast heartbeat  . A bad rash all over body  . Dizziness and weakness   Immunizations Administered    Name Date Dose VIS Date Route   Pfizer COVID-19 Vaccine 06/26/2019  4:17 PM 0.3 mL 03/20/2019 Intramuscular   Manufacturer: [employer], [employer]   Lot: [id number]   NDC: [address]-9

## 2019-07-22 ENCOUNTER — Ambulatory Visit: Payer: 59 | Attending: Internal Medicine

## 2019-07-22 DIAGNOSIS — Z23 Encounter for immunization: Secondary | ICD-10-CM

## 2019-07-22 NOTE — Progress Notes (Signed)
   Covid-19 Vaccination Clinic  Name:  Holly Austin    MRN: [id number] DOB: 03/05/1966  07/22/2019  [name] was observed post Covid-19 immunization for 15 minutes without incident. She was provided with Vaccine Information Sheet and instruction to access the V-Safe system.   [name] was instructed to call 911 with any severe reactions post vaccine: Marland Kitchen Difficulty breathing  . Swelling of face and throat  . A fast heartbeat  . A bad rash all over body  . Dizziness and weakness   Immunizations Administered    Name Date Dose VIS Date Route   Pfizer COVID-19 Vaccine 07/22/2019  4:03 PM 0.3 mL 03/20/2019 Intramuscular   Manufacturer: [employer], [employer]   Lot: [id number]   NDC: [address]-7

## 2019-08-18 ENCOUNTER — Other Ambulatory Visit: Payer: Self-pay | Admitting: Orthopedic Surgery

## 2019-08-18 ENCOUNTER — Ambulatory Visit
Admission: RE | Admit: 2019-08-18 | Discharge: 2019-08-18 | Disposition: A | Discharge status: Home / Self Care | Payer: Self-pay | Source: Ambulatory Visit | Attending: Orthopedic Surgery | Admitting: Orthopedic Surgery

## 2019-08-18 DIAGNOSIS — M7502 Adhesive capsulitis of left shoulder: Secondary | ICD-10-CM

## 2019-08-18 DIAGNOSIS — M778 Other enthesopathies, not elsewhere classified: Secondary | ICD-10-CM

## 2019-08-24 ENCOUNTER — Other Ambulatory Visit: Payer: Self-pay | Admitting: Orthopedic Surgery

## 2019-08-24 ENCOUNTER — Ambulatory Visit
Admission: RE | Admit: 2019-08-24 | Discharge: 2019-08-24 | Disposition: A | Discharge status: Home / Self Care | Payer: 59 | Source: Ambulatory Visit | Attending: Orthopedic Surgery | Admitting: Orthopedic Surgery

## 2019-08-24 ENCOUNTER — Ambulatory Visit: Payer: 59

## 2019-08-24 ENCOUNTER — Other Ambulatory Visit: Payer: Self-pay

## 2019-08-24 DIAGNOSIS — M7502 Adhesive capsulitis of left shoulder: Secondary | ICD-10-CM

## 2019-08-24 MED ORDER — TRIAMCINOLONE ACETONIDE 40 MG/ML IJ SUSP
INTRAMUSCULAR | Status: AC
  Filled 2019-08-24: qty 1

## 2019-08-24 MED ORDER — ROPIVACAINE HCL 5 MG/ML IJ SOLN
7.0000 mL | Freq: Once | INTRAMUSCULAR | Status: AC
  Administered 2019-08-24: 7 mL via EPIDURAL

## 2019-08-24 MED ORDER — TRIAMCINOLONE ACETONIDE 40 MG/ML IJ SUSP (RADIOLOGY)
40.0000 mg | Freq: Once | INTRAMUSCULAR | Status: AC
  Administered 2019-08-24: 40 mg

## 2019-08-24 MED ORDER — IOHEXOL 180 MG/ML  SOLN
15.0000 mL | Freq: Once | INTRAMUSCULAR | Status: AC
  Administered 2019-08-24: 15 mL

## 2019-08-24 MED ORDER — LIDOCAINE HCL (PF) 1 % IJ SOLN
5.0000 mL | Freq: Once | INTRAMUSCULAR | Status: AC
  Administered 2019-08-24: 5 mL

## 2019-08-24 MED ORDER — ROPIVACAINE HCL 5 MG/ML IJ SOLN
INTRAMUSCULAR | Status: AC
  Filled 2019-08-24: qty 30

## 2020-03-15 NOTE — Progress Notes (Signed)
MyChart Video Visit    Virtual Visit via Video Note   This visit type was conducted due to national recommendations for restrictions regarding the COVID-19 Pandemic (e.g. social distancing) in an effort to limit this patient's exposure and mitigate transmission in our community. This patient is at least at moderate risk for complications without adequate follow up. This format is felt to be most appropriate for this patient at this time. Physical exam was limited by quality of the video and audio technology used for the visit.   Patient location: Home Provider location: Office   I discussed the limitations of evaluation and management by telemedicine and the availability of in person appointments. The patient expressed understanding and agreed to proceed.  Patient: Holly Austin   DOB: [birth year]   54 y.o. Female  MRN: [id number] Visit Date: 03/16/2020  Today's healthcare provider: Trey Sailors, PA-C   Chief Complaint  Patient presents with  . Cough  I,Holly Austin,acting as a scribe for Trey Sailors, PA-C.,have documented all relevant documentation on the behalf of Trey Sailors, PA-C,as directed by  Trey Sailors, PA-C while in the presence of Trey Sailors, PA-C.  Subjective    Cough This is a new problem. The current episode started 1 to 4 weeks ago. The problem has been unchanged. The cough is productive of sputum. Associated symptoms include headaches, heartburn, nasal congestion, postnasal drip, rhinorrhea, a sore throat and shortness of breath. Pertinent negatives include no chest pain, chills, fever or wheezing. Nothing aggravates the symptoms. She has tried OTC cough suppressant and a beta-agonist inhaler for the symptoms. The treatment provided mild relief. Her past medical history is significant for asthma. There is no history of bronchiectasis, bronchitis, COPD, emphysema, environmental allergies or pneumonia.    She has a history of cough and  congestion. Currently smoking one pack a day and has been smoking since the age of 78. CXR 2018 has hyperinflation and CXR from 2011 has chronic bronchitic changes. She reports some wheezing.     Medications: Outpatient Medications Prior to Visit  Medication Sig  . albuterol (PROVENTIL HFA;VENTOLIN HFA) 108 (90 Base) MCG/ACT inhaler Inhale 2 puffs into the lungs every 6 (six) hours as needed for wheezing or shortness of breath.  . hydrOXYzine (ATARAX/VISTARIL) 25 MG tablet Take 1 tablet (25 mg total) by mouth every 6 (six) hours as needed for anxiety (anxiety/agitation or CIWA < or = 10).  . [DISCONTINUED] predniSONE (DELTASONE) 10 MG tablet Take 6 pills on day 1, 5 pills on day 2 and so on until complete.   No facility-administered medications prior to visit.    Review of Systems  Constitutional: Positive for fatigue. Negative for chills and fever.  HENT: Positive for postnasal drip, rhinorrhea, sinus pressure, sinus pain, sneezing and sore throat.   Respiratory: Positive for cough and shortness of breath. Negative for wheezing.   Cardiovascular: Negative for chest pain.  Gastrointestinal: Positive for heartburn.  Allergic/Immunologic: Negative for environmental allergies.  Neurological: Positive for weakness and headaches.      Objective    There were no vitals taken for this visit.   Physical Exam Constitutional:      Appearance: Normal appearance.  Pulmonary:     Effort: Pulmonary effort is normal. No respiratory distress.  Neurological:     Mental Status: She is alert.  Psychiatric:        Mood and Affect: Mood normal.        Behavior: Behavior  normal.        Assessment & Plan    1. COPD exacerbation (HCC)  - doxycycline (VIBRA-TABS) 100 MG tablet; Take 1 tablet (100 mg total) by mouth 2 (two) times daily for 7 days.  Dispense: 14 tablet; Refill: 0 - albuterol (VENTOLIN HFA) 108 (90 Base) MCG/ACT inhaler; Inhale 2 puffs into the lungs every 6 (six) hours as  needed for wheezing or shortness of breath.  Dispense: 1 each; Refill: 2 - predniSONE (DELTASONE) 20 MG tablet; Take 1 tablet (20 mg total) by mouth daily with breakfast.  Dispense: 5 tablet; Refill: 0  2. Chronic bronchitis, unspecified chronic bronchitis type (HCC)  Counseled on smoking cessation.   No follow-ups on file.     I discussed the assessment and treatment plan with the patient. The patient was provided an opportunity to ask questions and all were answered. The patient agreed with the plan and demonstrated an understanding of the instructions.   The patient was advised to call back or seek an in-person evaluation if the symptoms worsen or if the condition fails to improve as anticipated.   ITrey Sailors, PA-C, have reviewed all documentation for this visit. The documentation on 03/16/20 for the exam, diagnosis, procedures, and orders are all accurate and complete.  The entirety of the information documented in the History of Present Illness, Review of Systems and Physical Exam were personally obtained by me. Portions of this information were initially documented by [facility] and reviewed by me for thoroughness and accuracy.    Holly Austin [facility] [phone] (phone) [phone] (fax)  [facility]

## 2020-03-16 ENCOUNTER — Telehealth (INDEPENDENT_AMBULATORY_CARE_PROVIDER_SITE_OTHER): Payer: 59 | Admitting: Physician Assistant

## 2020-03-16 ENCOUNTER — Encounter: Payer: Self-pay | Admitting: Physician Assistant

## 2020-03-16 DIAGNOSIS — J42 Unspecified chronic bronchitis: Secondary | ICD-10-CM

## 2020-03-16 DIAGNOSIS — J441 Chronic obstructive pulmonary disease with (acute) exacerbation: Secondary | ICD-10-CM | POA: Diagnosis not present

## 2020-03-16 MED ORDER — ALBUTEROL SULFATE HFA 108 (90 BASE) MCG/ACT IN AERS: 2.0000 | INHALATION_SPRAY | Freq: Four times a day (QID) | RESPIRATORY_TRACT | 2 refills | Status: AC | PRN

## 2020-03-16 MED ORDER — PREDNISONE 20 MG PO TABS: 20.0000 mg | ORAL_TABLET | Freq: Every day | ORAL | 0 refills | Status: DC

## 2020-03-16 MED ORDER — DOXYCYCLINE HYCLATE 100 MG PO TABS: 100.0000 mg | ORAL_TABLET | Freq: Two times a day (BID) | ORAL | 0 refills | Status: AC

## 2020-04-15 ENCOUNTER — Encounter: Payer: Self-pay | Admitting: Physician Assistant

## 2020-05-17 ENCOUNTER — Other Ambulatory Visit: Payer: Self-pay

## 2020-05-17 ENCOUNTER — Other Ambulatory Visit (HOSPITAL_COMMUNITY)
Admission: RE | Admit: 2020-05-17 | Discharge: 2020-05-17 | Disposition: A | Discharge status: Home / Self Care | Payer: 59 | Source: Ambulatory Visit | Attending: Physician Assistant | Admitting: Physician Assistant

## 2020-05-17 ENCOUNTER — Ambulatory Visit (INDEPENDENT_AMBULATORY_CARE_PROVIDER_SITE_OTHER): Payer: 59 | Admitting: Physician Assistant

## 2020-05-17 ENCOUNTER — Encounter: Payer: Self-pay | Admitting: Physician Assistant

## 2020-05-17 VITALS — BP 108/72 | HR 74 | Temp 98.7°F | Resp 16 | Ht 70.0 in | Wt 188.9 lb

## 2020-05-17 DIAGNOSIS — Z124 Encounter for screening for malignant neoplasm of cervix: Secondary | ICD-10-CM | POA: Insufficient documentation

## 2020-05-17 DIAGNOSIS — F1994 Other psychoactive substance use, unspecified with psychoactive substance-induced mood disorder: Secondary | ICD-10-CM

## 2020-05-17 DIAGNOSIS — M2550 Pain in unspecified joint: Secondary | ICD-10-CM

## 2020-05-17 DIAGNOSIS — Z23 Encounter for immunization: Secondary | ICD-10-CM | POA: Diagnosis not present

## 2020-05-17 DIAGNOSIS — Z1159 Encounter for screening for other viral diseases: Secondary | ICD-10-CM

## 2020-05-17 DIAGNOSIS — Z1231 Encounter for screening mammogram for malignant neoplasm of breast: Secondary | ICD-10-CM

## 2020-05-17 DIAGNOSIS — Z0000 Encounter for general adult medical examination without abnormal findings: Secondary | ICD-10-CM | POA: Diagnosis not present

## 2020-05-17 DIAGNOSIS — J449 Chronic obstructive pulmonary disease, unspecified: Secondary | ICD-10-CM

## 2020-05-17 DIAGNOSIS — Z1211 Encounter for screening for malignant neoplasm of colon: Secondary | ICD-10-CM | POA: Diagnosis not present

## 2020-05-17 DIAGNOSIS — Z114 Encounter for screening for human immunodeficiency virus [HIV]: Secondary | ICD-10-CM

## 2020-05-17 MED ORDER — BREO ELLIPTA 100-25 MCG/INH IN AEPB: 1.0000 | INHALATION_SPRAY | Freq: Every day | RESPIRATORY_TRACT | 1 refills | Status: AC

## 2020-05-17 NOTE — Patient Instructions (Signed)
Preventive Care 84-53 Years Old, Female Preventive care refers to lifestyle choices and visits with your health care provider that can promote health and wellness. This includes:  A yearly physical exam. This is also called an annual wellness visit.  Regular dental and eye exams.  Immunizations.  Screening for certain conditions.  Healthy lifestyle choices, such as: ? Eating a healthy diet. ? Getting regular exercise. ? Not using drugs or products that contain nicotine and tobacco. ? Limiting alcohol use. What can I expect for my preventive care visit? Physical exam Your health care provider will check your:  Height and weight. These may be used to calculate your BMI (body mass index). BMI is a measurement that tells if you are at a healthy weight.  Heart rate and blood pressure.  Body temperature.  Skin for abnormal spots. Counseling Your health care provider may ask you questions about your:  Past medical problems.  Family's medical history.  Alcohol, tobacco, and drug use.  Emotional well-being.  Home life and relationship well-being.  Sexual activity.  Diet, exercise, and sleep habits.  Work and work Statistician.  Access to firearms.  Method of birth control.  Menstrual cycle.  Pregnancy history. What immunizations do I need? Vaccines are usually given at various ages, according to a schedule. Your health care provider will recommend vaccines for you based on your age, medical history, and lifestyle or other factors, such as travel or where you work.   What tests do I need? Blood tests  Lipid and cholesterol levels. These may be checked every 5 years, or more often if you are over 3 years old.  Hepatitis C test.  Hepatitis B test. Screening  Lung cancer screening. You may have this screening every year starting at age 73 if you have a 30-pack-year history of smoking and currently smoke or have quit within the past 15 years.  Colorectal cancer  screening. ? All adults should have this screening starting at age 52 and continuing until age 17. ? Your health care provider may recommend screening at age 49 if you are at increased risk. ? You will have tests every 1-10 years, depending on your results and the type of screening test.  Diabetes screening. ? This is done by checking your blood sugar (glucose) after you have not eaten for a while (fasting). ? You may have this done every 1-3 years.  Mammogram. ? This may be done every 1-2 years. ? Talk with your health care provider about when you should start having regular mammograms. This may depend on whether you have a family history of breast cancer.  BRCA-related cancer screening. This may be done if you have a family history of breast, ovarian, tubal, or peritoneal cancers.  Pelvic exam and Pap test. ? This may be done every 3 years starting at age 10. ? Starting at age 11, this may be done every 5 years if you have a Pap test in combination with an HPV test. Other tests  STD (sexually transmitted disease) testing, if you are at risk.  Bone density scan. This is done to screen for osteoporosis. You may have this scan if you are at high risk for osteoporosis. Talk with your health care provider about your test results, treatment options, and if necessary, the need for more tests. Follow these instructions at home: Eating and drinking  Eat a diet that includes fresh fruits and vegetables, whole grains, lean protein, and low-fat dairy products.  Take vitamin and mineral supplements  as recommended by your health care provider.  Do not drink alcohol if: ? Your health care provider tells you not to drink. ? You are pregnant, may be pregnant, or are planning to become pregnant.  If you drink alcohol: ? Limit how much you have to 0-1 drink a day. ? Be aware of how much alcohol is in your drink. In the [place] one drink equals one 12 oz bottle of beer (355 mL), one 5 oz glass of  wine (148 mL), or one 1 oz glass of hard liquor (44 mL).   Lifestyle  Take daily care of your teeth and gums. Brush your teeth every morning and night with fluoride toothpaste. Floss one time each day.  Stay active. Exercise for at least 30 minutes 5 or more days each week.  Do not use any products that contain nicotine or tobacco, such as cigarettes, e-cigarettes, and chewing tobacco. If you need help quitting, ask your health care provider.  Do not use drugs.  If you are sexually active, practice safe sex. Use a condom or other form of protection to prevent STIs (sexually transmitted infections).  If you do not wish to become pregnant, use a form of birth control. If you plan to become pregnant, see your health care provider for a prepregnancy visit.  If told by your health care provider, take low-dose aspirin daily starting at age 50.  Find healthy ways to cope with stress, such as: ? Meditation, yoga, or listening to music. ? Journaling. ? Talking to a trusted person. ? Spending time with friends and family. Safety  Always wear your seat belt while driving or riding in a vehicle.  Do not drive: ? If you have been drinking alcohol. Do not ride with someone who has been drinking. ? When you are tired or distracted. ? While texting.  Wear a helmet and other protective equipment during sports activities.  If you have firearms in your house, make sure you follow all gun safety procedures. What's next?  Visit your health care provider once a year for an annual wellness visit.  Ask your health care provider how often you should have your eyes and teeth checked.  Stay up to date on all vaccines. This information is not intended to replace advice given to you by your health care provider. Make sure you discuss any questions you have with your health care provider. Document Revised: 12/29/2019 Document Reviewed: 12/05/2017 Elsevier Patient Education  2021 Elsevier Inc.  
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
## 2020-05-24 LAB — CYTOLOGY - PAP
Comment: NEGATIVE
Diagnosis: NEGATIVE
Diagnosis: REACTIVE
High risk HPV: NEGATIVE

## 2020-09-03 ENCOUNTER — Telehealth: Payer: 59 | Admitting: Orthopedic Surgery

## 2020-09-03 DIAGNOSIS — R0989 Other specified symptoms and signs involving the circulatory and respiratory systems: Secondary | ICD-10-CM | POA: Diagnosis not present

## 2020-09-03 DIAGNOSIS — J029 Acute pharyngitis, unspecified: Secondary | ICD-10-CM

## 2020-09-03 MED ORDER — BENZONATATE 100 MG PO CAPS: 100.0000 mg | ORAL_CAPSULE | Freq: Three times a day (TID) | ORAL | 0 refills | Status: AC | PRN

## 2020-09-03 MED ORDER — DOXYCYCLINE HYCLATE 100 MG PO TABS
100.0000 mg | ORAL_TABLET | Freq: Two times a day (BID) | ORAL | 0 refills | Status: AC
Start: 2020-09-03 — End: 2020-09-13

## 2020-09-03 NOTE — Progress Notes (Signed)
We are sorry that you are not feeling well.  Here is how we plan to help!  Based on what you have shared with me it looks like you have sinusitis.  Sinusitis is inflammation and infection in the sinus cavities of the head.  Based on your presentation I believe you most likely have Acute Bacterial Sinusitis.  This is an infection caused by bacteria and is treated with antibiotics. I have prescribed Doxycycline 100mg by mouth twice a day for 10 days. You may use an oral decongestant such as Mucinex D or if you have glaucoma or high blood pressure use plain Mucinex. Saline nasal spray help and can safely be used as often as needed for congestion.  If you develop worsening sinus pain, fever or notice severe headache and vision changes, or if symptoms are not better after completion of antibiotic, please schedule an appointment with a health care provider.    Sinus infections are not as easily transmitted as other respiratory infection, however we still recommend that you avoid close contact with loved ones, especially the very young and elderly.  Remember to wash your hands thoroughly throughout the day as this is the number one way to prevent the spread of infection!  Home Care:  Only take medications as instructed by your medical team.  Complete the entire course of an antibiotic.  Do not take these medications with alcohol.  A steam or ultrasonic humidifier can help congestion.  You can place a towel over your head and breathe in the steam from hot water coming from a faucet.  Avoid close contacts especially the very young and the elderly.  Cover your mouth when you cough or sneeze.  Always remember to wash your hands.  Get Help Right Away If:  You develop worsening fever or sinus pain.  You develop a severe head ache or visual changes.  Your symptoms persist after you have completed your treatment plan.  Make sure you  Understand these instructions.  Will watch your  condition.  Will get help right away if you are not doing well or get worse.  Your e-visit answers were reviewed by a board certified advanced clinical practitioner to complete your personal care plan.  Depending on the condition, your plan could have included both over the counter or prescription medications.  If there is a problem please reply  once you have received a response from your provider.  Your safety is important to us.  If you have drug allergies check your prescription carefully.    You can use MyChart to ask questions about today's visit, request a non-urgent call back, or ask for a work or school excuse for 24 hours related to this e-Visit. If it has been greater than 24 hours you will need to follow up with your provider, or enter a new e-Visit to address those concerns.  You will get an e-mail in the next two days asking about your experience.  I hope that your e-visit has been valuable and will speed your recovery. Thank you for using e-visits.     Greater than 5 minutes, yet less than 10 minutes of time have been spent researching, coordinating and implementing care for this patient today.    

## 2020-09-03 NOTE — Progress Notes (Signed)
E-Visit for Corona Virus Screening  Your current symptoms could be consistent with the coronavirus.  Many health care providers can now test patients at their office but not all are.  [facility] has multiple testing sites. For information on our [place] testing locations and hours go to [email or web address]  We are enrolling you in our [place] for [place] . Daily you will receive a questionnaire within the [place] website. Our COVID 19 response team will be monitoring your responses daily.  Testing Information: The COVID-19 Community Testing sites are testing BY APPOINTMENT ONLY.  You can schedule online at [email or web address]  If you do not have access to a smart phone or computer you may call [phone] for an appointment.   Additional testing sites in the Community:  . For [facility] Testing sites in [facility]  [email or web address]  . For Pop-[facility] testing sites in [place]  BowlDirectory.co.uk  . For [facility] [email or web address]  . For [facility] testing in [place] and [employer] [email or web address]  . For [facility] testing in [facility]   [email or web address]  For  more information about community testing call [phone]   Please quarantine yourself while awaiting your test results. Please stay home for a minimum of 10 days from the first day of illness with improving symptoms and you have had 24 hours of no fever (without the use of Tylenol (Acetaminophen) Motrin (Ibuprofen) or any fever reducing medication).  Also - Do not get tested prior to returning to work because once you have had a positive test the test can stay  positive for more than a month in some cases.   You should wear a mask or cloth face covering over your nose and mouth if you must be around other people or animals, including pets (even at home). Try to stay at least 6 feet away from other people. This will protect the people around you.  Please continue good preventive care measures, including:  frequent hand-washing, avoid touching your face, cover coughs/sneezes, stay out of crowds and keep a 6 foot distance from others.  COVID-19 is a respiratory illness with symptoms that are similar to the flu. Symptoms are typically mild to moderate, but there have been cases of severe illness and death due to the virus.   The following symptoms may appear 2-14 days after exposure: . Fever . Cough . Shortness of breath or difficulty breathing . Chills . Repeated shaking with chills . Muscle pain . Headache . Sore throat . New loss of taste or smell . Fatigue . Congestion or runny nose . Nausea or vomiting . Diarrhea  Go to the nearest hospital ED for assessment if fever/cough/breathlessness are severe or illness seems like a threat to life.  It is vitally important that if you feel that you have an infection such as this virus or any other virus that you stay home and away from places where you may spread it to others.  You should avoid contact with people age 49 and older.   You can use medication such as prescription cough medication called Tessalon Perles 100 mg. You may take 1-2 capsules every 8 hours as needed for cough  You may also take acetaminophen (Tylenol) as needed for fever.  Reduce your risk of any infection by using the same precautions used for avoiding the common cold or flu:  Mar[place] Kitchen Wash your hands often with soap and warm water for at least 20 seconds.  If soap and water are not readily  available, use an alcohol-based hand sanitizer with at least 60% alcohol.  . If coughing or sneezing, cover your mouth and nose by coughing or  sneezing into the elbow areas of your shirt or coat, into a tissue or into your sleeve (not your hands). . Avoid shaking hands with others and consider head nods or verbal greetings only. . Avoid touching your eyes, nose, or mouth with unwashed hands.  . Avoid close contact with people who are sick. . Avoid places or events with large numbers of people in one location, like concerts or sporting events. . Carefully consider travel plans you have or are making. . If you are planning any travel outside or inside the [place], visit the CDC's Travelers' Health webpage for the latest health notices. . If you have some symptoms but not all symptoms, continue to monitor at home and seek medical attention if your symptoms worsen. . If you are having a medical emergency, call 911.  HOME CARE . Only take medications as instructed by your medical team. . Drink plenty of fluids and get plenty of rest. . A steam or ultrasonic humidifier can help if you have congestion.   GET HELP RIGHT AWAY IF YOU HAVE EMERGENCY WARNING SIGNS** FOR COVID-19. If you or someone is showing any of these signs seek emergency medical care immediately. Call 911 or proceed to your closest emergency facility if: . You develop worsening high fever. . Trouble breathing . Bluish lips or face . Persistent pain or pressure in the chest . New confusion . Inability to wake or stay awake . You cough up blood. . Your symptoms become more severe  **This list is not all possible symptoms. Contact your medical provider for any symptoms that are sever or concerning to you.  MAKE SURE YOU   Understand these instructions.  Will watch your condition.  Will get help right away if you are not doing well or get worse.  Your e-visit answers were reviewed by a board certified advanced clinical practitioner to complete your personal care plan.  Depending on the condition, your plan could have included both over the counter or prescription  medications.  If there is a problem please reply once you have received a response from your provider.  Your safety is important to [place].  If you have drug allergies check your prescription carefully.    You can use MyChart to ask questions about today's visit, request a non-urgent call back, or ask for a work or school excuse for 24 hours related to this e-Visit. If it has been greater than 24 hours you will need to follow up with your provider, or enter a new e-Visit to address those concerns. You will get an e-mail in the next two days asking about your experience.  I hope that your e-visit has been valuable and will speed your recovery. Thank you for using e-visits.   Greater than 5 minutes, yet less than 10 minutes of time have been spent researching, coordinating and implementing care for this patient today.

## 2021-05-22 IMAGING — RF DG FLUORO GUIDE NDL PLC/BX
1 series · 1 of 1 positions shown · non-contrast
Comparison: none

CLINICAL DATA: Adhesive capsulitis

[Series 1: cp_standard · 0.17mm/px · 1 of 1 slices shown]
[im 1/1]
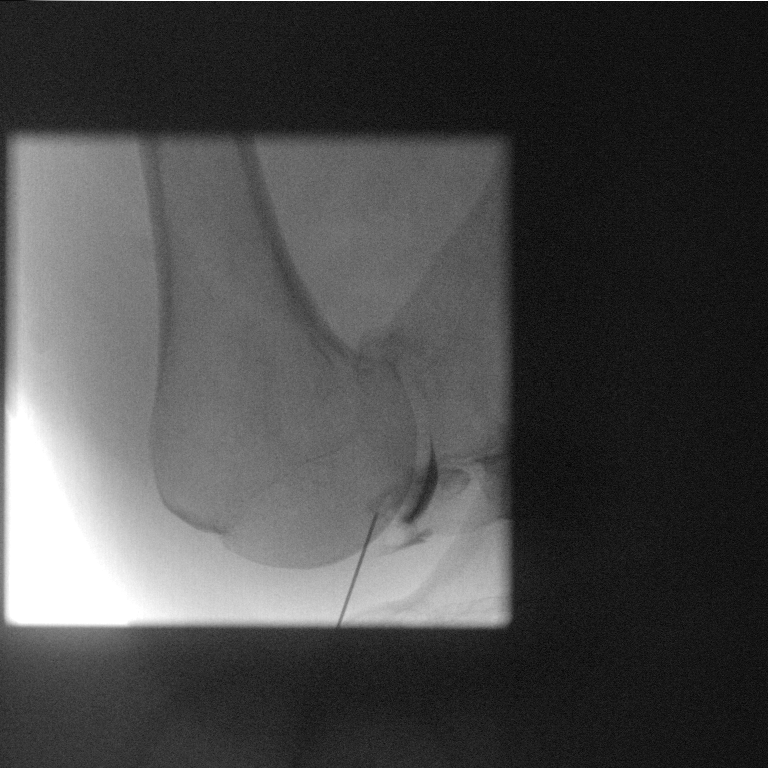

[1 of 1 positions shown; findings below may reference images not displayed]

EXAM:
LEFT SHOULDER INJECTION UNDER FLUOROSCOPY

FLUOROSCOPY TIME:  Fluoroscopy Time:  0.2 minute

Radiation Exposure Index (if provided by the fluoroscopic device):
1.9 mGy

Number of Acquired Spot Images: 0

PROCEDURE:
After informed consent was obtained explaining the risks, benefits,
and possible complications of the procedure, the patient was placed
supine on the fluoroscopy table. A formal time procedure was
performed according to department of protocol.

The left shoulder was prepped and draped in the usual sterile
fashion. 1% lidocaine was used to anesthetize the skin. A 22 gauge
spinal needle was advanced into the joint space under fluoroscopic
guidance. Less than 1ml of Omnipaque 180 was administered to confirm
intra-articular position under fluoroscopic guidance.

Subsequently, 7 ml of Naropin and 1ml (40 mg) of Kenalog were
injected into the left glenohumeral joint space.

Hemostasis was achieved. Patient tolerated the procedure well. There
were no immediate complications.
IMPRESSION: Technically successful left shoulder injection under fluoroscopy.

## 2022-09-01 ENCOUNTER — Other Ambulatory Visit: Payer: Self-pay

## 2022-09-01 ENCOUNTER — Emergency Department: Payer: 59

## 2022-09-01 ENCOUNTER — Emergency Department
Admission: EM | Admit: 2022-09-01 | Discharge: 2022-09-01 | Disposition: A | Discharge status: Home / Self Care | Payer: 59 | Attending: Emergency Medicine | Admitting: Emergency Medicine

## 2022-09-01 DIAGNOSIS — R1030 Lower abdominal pain, unspecified: Secondary | ICD-10-CM | POA: Diagnosis not present

## 2022-09-01 DIAGNOSIS — R319 Hematuria, unspecified: Secondary | ICD-10-CM | POA: Insufficient documentation

## 2022-09-01 LAB — BASIC METABOLIC PANEL
Anion gap: 7 (ref 5–15)
BUN: 12 mg/dL (ref 6–20)
CO2: 22 mmol/L (ref 22–32)
Calcium: 8.5 mg/dL — ABNORMAL LOW (ref 8.9–10.3)
Chloride: 104 mmol/L (ref 98–111)
Creatinine, Ser: 0.63 mg/dL (ref 0.44–1.00)
GFR, Estimated: >60 mL/min (ref >60)
Glucose, Bld: 89 mg/dL (ref 70–99)
Potassium: 3.6 mmol/L (ref 3.5–5.1)
Sodium: 133 mmol/L — ABNORMAL LOW (ref 135–145)

## 2022-09-01 LAB — URINALYSIS, ROUTINE W REFLEX MICROSCOPIC
Bacteria, UA: NONE SEEN
Bilirubin Urine: NEGATIVE
Glucose, UA: NEGATIVE mg/dL
Ketones, ur: NEGATIVE mg/dL
Nitrite: NEGATIVE
Protein, ur: 30 mg/dL — AB
Specific Gravity, Urine: 1.001 — ABNORMAL LOW (ref 1.005–1.030)
Squamous Epithelial / HPF: NONE SEEN /HPF (ref 0–5)
pH: 6 (ref 5.0–8.0)

## 2022-09-01 LAB — CBC
HCT: 42.9 % (ref 36.0–46.0)
Hemoglobin: 14.3 g/dL (ref 12.0–15.0)
MCH: 32.2 pg (ref 26.0–34.0)
MCHC: 33.3 g/dL (ref 30.0–36.0)
MCV: 96.6 fL (ref 80.0–100.0)
Platelets: 286 10*3/uL (ref 150–400)
RBC: 4.44 MIL/uL (ref 3.87–5.11)
RDW: 12.6 % (ref 11.5–15.5)
WBC: 8.8 10*3/uL (ref 4.0–10.5)
nRBC: 0 % (ref 0.0–0.2)

## 2022-09-01 LAB — CK: Total CK: 76 U/L (ref 38–234)

## 2022-09-01 MED ORDER — SULFAMETHOXAZOLE-TRIMETHOPRIM 800-160 MG PO TABS
1.0000 | ORAL_TABLET | Freq: Once | ORAL | Status: AC
  Administered 2022-09-01: 1 via ORAL
  Filled 2022-09-01: qty 1

## 2022-09-01 MED ORDER — SULFAMETHOXAZOLE-TRIMETHOPRIM 800-160 MG PO TABS: 1.0000 | ORAL_TABLET | Freq: Two times a day (BID) | ORAL | 0 refills | Status: AC

## 2022-09-01 NOTE — ED Provider Notes (Signed)
[facility] Provider Note    Event Date/Time   First MD Initiated Contact with Patient 09/01/22 2044     (approximate)   History   Hematuria   HPI  BRANDYE DERYKE is a 57 y.o. female   Past medical history of migraines, sciatica, depression, who is here with dark red urine starting yesterday.  She has some discomfort in her lower abdomen radiates back to her bilateral flanks.  She has had no fever.  She has had no dysuria or frequency.  She has had no other GI symptoms including nausea vomiting or diarrhea.  Of note she says she was lifting boxes for work yesterday as well.  No obvious injuries.  No history of kidney stone.  No vaginal discharge, no vaginal bleeding, no rectal bleeding.  She is certain that this is blood in her urine rather than vaginal or rectal bleeding.       Physical Exam   Triage Vital Signs: ED Triage Vitals  Enc Vitals Group     BP 09/01/22 2007 (!) 118/91     Pulse Rate 09/01/22 2007 84     Resp 09/01/22 2007 16     Temp 09/01/22 2007 97.8 F (36.6 C)     Temp Source 09/01/22 2007 Oral     SpO2 09/01/22 2007 92 %     Weight 09/01/22 2008 188 lb (85.3 kg)     Height 09/01/22 2008 5\' 10"  (1.778 m)     Head Circumference --      Peak Flow --      Pain Score 09/01/22 2008 2     Pain Loc --      Pain Edu? --      Excl. in GC? --     Most recent vital signs: Vitals:   09/01/22 2007 09/01/22 2120  BP: (!) 118/91 (!) 130/97  Pulse: 84 78  Resp: 16 16  Temp: 97.8 F (36.6 C)   SpO2: 92% 95%    General: Awake, no distress.  CV:  Good peripheral perfusion.  Resp:  Normal effort.  Abd:  No distention.  Other:  Awake alert comfortable with normal hemodynamics afebrile soft nontender abdomen no CVA tenderness.  She defers a rectal or vaginal exam stating that she is certain that her blood is coming from the urine.   ED Results / Procedures / Treatments   Labs (all labs ordered are listed, but only abnormal  results are displayed) Labs Reviewed  URINALYSIS, ROUTINE W REFLEX MICROSCOPIC - Abnormal; Notable for the following components:      Result Value   Color, Urine YELLOW (*)    APPearance HAZY (*)    Specific Gravity, Urine 1.001 (*)    Hgb urine dipstick LARGE (*)    Protein, ur 30 (*)    Leukocytes,Ua TRACE (*)    All other components within normal limits  BASIC METABOLIC PANEL - Abnormal; Notable for the following components:   Sodium 133 (*)    Calcium 8.5 (*)    All other components within normal limits  URINE CULTURE  CBC  CK     I ordered and reviewed the above labs they are notable for she has hemoglobin in the urine but no red blood cells.  Her CK is normal.  Her electrolytes are normal and her creatinine is normal as well.  H&H is normal.   RADIOLOGY I independently reviewed and interpreted CT scan of the abdomen pelvis and see no obvious  inflammatory or infectious changes and no obvious renal stones   PROCEDURES:  Critical Care performed: No  Procedures   MEDICATIONS ORDERED IN ED: Medications  sulfamethoxazole-trimethoprim (BACTRIM DS) 800-160 MG per tablet 1 tablet (1 tablet Oral Given 09/01/22 2120)    IMPRESSION / MDM / ASSESSMENT AND PLAN / ED COURSE  I reviewed the triage vital signs and the nursing notes.                                Patient's presentation is most consistent with acute presentation with potential threat to life or bodily function.  Differential diagnosis includes, but is not limited to, rhabdomyolysis, urologic cancer, urinary tract infection, kidney stone   The patient is on the cardiac monitor to evaluate for evidence of arrhythmia and/or significant heart rate changes.  MDM: This is a patient with dark red urine with a normal-appearing urinalysis other than hemoglobin and no red blood cells, no signs of infection in the urine, normal CK, normal electrolytes, and comfortable nontoxic appearance.  I am not certain what is  causing her symptoms but I doubt kidney stone, doubt urine infection, doubt rhabdo at this time.  I will treat her with a short course of antibiotics in case this is a urine infection and send off for urine culture.  She will follow-up with urology.       FINAL CLINICAL IMPRESSION(S) / ED DIAGNOSES   Final diagnoses:  Hematuria, unspecified type     Rx / DC Orders   ED Discharge Orders          Ordered    sulfamethoxazole-trimethoprim (BACTRIM DS) 800-160 MG tablet  2 times daily        09/01/22 2118             Note:  This document was prepared using Dragon voice recognition software and may include unintentional dictation errors.    Pilar Jarvis, MD 09/02/22 [phone]

## 2022-09-01 NOTE — Discharge Instructions (Addendum)
It is unclear what is causing the redness in your urine.  Although your urine does not appear infected, this may be a possibility so I prescribed you an antibiotic.  Take as prescribed for the full course.  Your CK level in your blood looks normal so this does not appear to be due to muscle breakdown.  There does not appear to be any changes in your kidneys or signs of kidney stones on your CT imaging from the emergency department.  Please call Dr. Arita Miss of urology to schedule follow-up appointment for further testing.  Drink plenty of fluids to stay well-hydrated.  Take acetaminophen 650 mg and ibuprofen 400 mg every 6 hours for pain.  Take with food.   If you experience any new, worsening, or unexpected symptoms come back to the emergency department for reevaluation.

## 2022-09-01 NOTE — ED Triage Notes (Signed)
Pt to ED from home for bright red blood in her urine that started last night. Pt is CAOx4, in no acute distress and ambulatory in triage. Pt is also having some flank pain as well with some pressure and increased frequency.

## 2022-09-01 NOTE — ED Notes (Signed)
Called lab to addon CK and urine culture

## 2022-09-02 LAB — URINE CULTURE: Culture: NO GROWTH
